# Patient Record
Sex: Female | Born: 1950 | Race: White | Hispanic: No | Smoking: Never smoker
Health system: Southern US, Community
[De-identification: ages and names within clinical notes are randomized; demographics above are authoritative.]

## PROBLEM LIST (undated history)

## (undated) DIAGNOSIS — F32A Depression, unspecified: Secondary | ICD-10-CM

## (undated) DIAGNOSIS — I10 Essential (primary) hypertension: Secondary | ICD-10-CM

## (undated) DIAGNOSIS — E119 Type 2 diabetes mellitus without complications: Secondary | ICD-10-CM

## (undated) DIAGNOSIS — M1990 Unspecified osteoarthritis, unspecified site: Secondary | ICD-10-CM

## (undated) DIAGNOSIS — R51 Headache: Secondary | ICD-10-CM

## (undated) DIAGNOSIS — F329 Major depressive disorder, single episode, unspecified: Secondary | ICD-10-CM

## (undated) DIAGNOSIS — R519 Headache, unspecified: Secondary | ICD-10-CM

## (undated) DIAGNOSIS — E039 Hypothyroidism, unspecified: Secondary | ICD-10-CM

## (undated) HISTORY — PX: LAPAROSCOPY: SHX197

## (undated) HISTORY — PX: CHOLECYSTECTOMY: SHX55

## (undated) HISTORY — PX: TRIGGER FINGER RELEASE: SHX641

## (undated) HISTORY — PX: TUBAL LIGATION: SHX77

## (undated) HISTORY — PX: BREAST BIOPSY: SHX20

## (undated) HISTORY — PX: TONSILLECTOMY: SUR1361

## (undated) HISTORY — PX: APPENDECTOMY: SHX54

## (undated) HISTORY — PX: ABDOMINAL HYSTERECTOMY: SHX81

## (undated) HISTORY — PX: BACK SURGERY: SHX140

## (undated) HISTORY — PX: RECTAL PROLAPSE REPAIR: SHX759

## (undated) HISTORY — PX: CARPAL TUNNEL RELEASE: SHX101

---

## 1998-01-29 ENCOUNTER — Ambulatory Visit (HOSPITAL_COMMUNITY): Admission: RE | Admit: 1998-01-29 | Discharge: 1998-01-29 | Payer: Self-pay

## 2000-12-14 ENCOUNTER — Ambulatory Visit (HOSPITAL_BASED_OUTPATIENT_CLINIC_OR_DEPARTMENT_OTHER): Admission: RE | Admit: 2000-12-14 | Discharge: 2000-12-14 | Payer: Self-pay | Admitting: Orthopaedic Surgery

## 2001-02-11 ENCOUNTER — Other Ambulatory Visit: Admission: RE | Admit: 2001-02-11 | Discharge: 2001-02-11 | Payer: Self-pay | Admitting: Obstetrics & Gynecology

## 2003-11-24 DIAGNOSIS — K5730 Diverticulosis of large intestine without perforation or abscess without bleeding: Secondary | ICD-10-CM | POA: Insufficient documentation

## 2004-05-23 ENCOUNTER — Ambulatory Visit: Payer: Self-pay | Admitting: Family Medicine

## 2004-07-21 ENCOUNTER — Ambulatory Visit: Payer: Self-pay | Admitting: Family Medicine

## 2004-08-11 ENCOUNTER — Ambulatory Visit: Payer: Self-pay | Admitting: Family Medicine

## 2004-09-24 ENCOUNTER — Ambulatory Visit: Payer: Self-pay | Admitting: Family Medicine

## 2004-10-30 ENCOUNTER — Ambulatory Visit: Payer: Self-pay | Admitting: Family Medicine

## 2004-12-09 ENCOUNTER — Ambulatory Visit: Payer: Self-pay | Admitting: Family Medicine

## 2004-12-18 ENCOUNTER — Ambulatory Visit (HOSPITAL_BASED_OUTPATIENT_CLINIC_OR_DEPARTMENT_OTHER): Admission: RE | Admit: 2004-12-18 | Discharge: 2004-12-18 | Payer: Self-pay | Admitting: Orthopedic Surgery

## 2004-12-18 ENCOUNTER — Ambulatory Visit (HOSPITAL_COMMUNITY): Admission: RE | Admit: 2004-12-18 | Discharge: 2004-12-18 | Payer: Self-pay | Admitting: Orthopedic Surgery

## 2005-05-13 ENCOUNTER — Ambulatory Visit: Payer: Self-pay | Admitting: Family Medicine

## 2005-05-14 ENCOUNTER — Encounter: Admission: RE | Admit: 2005-05-14 | Discharge: 2005-05-14 | Payer: Self-pay | Admitting: Family Medicine

## 2005-07-03 ENCOUNTER — Ambulatory Visit: Payer: Self-pay | Admitting: Family Medicine

## 2005-07-15 ENCOUNTER — Ambulatory Visit: Payer: Self-pay | Admitting: Family Medicine

## 2005-07-30 ENCOUNTER — Encounter: Admission: RE | Admit: 2005-07-30 | Discharge: 2005-10-28 | Payer: Self-pay | Admitting: Family Medicine

## 2005-08-07 ENCOUNTER — Ambulatory Visit: Payer: Self-pay | Admitting: Family Medicine

## 2005-08-27 ENCOUNTER — Ambulatory Visit: Payer: Self-pay | Admitting: Family Medicine

## 2005-12-08 ENCOUNTER — Ambulatory Visit: Payer: Self-pay | Admitting: Family Medicine

## 2006-03-09 ENCOUNTER — Ambulatory Visit: Payer: Self-pay | Admitting: Family Medicine

## 2006-03-18 ENCOUNTER — Emergency Department (HOSPITAL_COMMUNITY): Admission: EM | Admit: 2006-03-18 | Discharge: 2006-03-18 | Payer: Self-pay | Admitting: Family Medicine

## 2006-05-07 ENCOUNTER — Ambulatory Visit (HOSPITAL_COMMUNITY): Admission: RE | Admit: 2006-05-07 | Discharge: 2006-05-07 | Payer: Self-pay | Admitting: Sports Medicine

## 2006-05-19 ENCOUNTER — Ambulatory Visit (HOSPITAL_BASED_OUTPATIENT_CLINIC_OR_DEPARTMENT_OTHER): Admission: RE | Admit: 2006-05-19 | Discharge: 2006-05-19 | Payer: Self-pay | Admitting: Orthopedic Surgery

## 2006-06-10 ENCOUNTER — Ambulatory Visit: Payer: Self-pay | Admitting: Family Medicine

## 2006-06-21 ENCOUNTER — Encounter: Admission: RE | Admit: 2006-06-21 | Discharge: 2006-06-21 | Payer: Self-pay | Admitting: Family Medicine

## 2007-04-28 ENCOUNTER — Encounter (INDEPENDENT_AMBULATORY_CARE_PROVIDER_SITE_OTHER): Payer: Self-pay | Admitting: Internal Medicine

## 2007-04-28 ENCOUNTER — Telehealth (INDEPENDENT_AMBULATORY_CARE_PROVIDER_SITE_OTHER): Payer: Self-pay | Admitting: *Deleted

## 2007-05-11 ENCOUNTER — Encounter (INDEPENDENT_AMBULATORY_CARE_PROVIDER_SITE_OTHER): Payer: Self-pay | Admitting: Internal Medicine

## 2007-05-11 ENCOUNTER — Ambulatory Visit: Payer: Self-pay | Admitting: Nurse Practitioner

## 2007-05-16 ENCOUNTER — Telehealth (INDEPENDENT_AMBULATORY_CARE_PROVIDER_SITE_OTHER): Payer: Self-pay | Admitting: Nurse Practitioner

## 2007-05-17 ENCOUNTER — Encounter (INDEPENDENT_AMBULATORY_CARE_PROVIDER_SITE_OTHER): Payer: Self-pay | Admitting: Nurse Practitioner

## 2007-05-17 DIAGNOSIS — R945 Abnormal results of liver function studies: Secondary | ICD-10-CM | POA: Insufficient documentation

## 2007-05-17 DIAGNOSIS — E039 Hypothyroidism, unspecified: Secondary | ICD-10-CM | POA: Insufficient documentation

## 2007-05-17 LAB — CONVERTED CEMR LAB
ALT: 50 units/L — ABNORMAL HIGH (ref 0–35)
AST: 41 units/L — ABNORMAL HIGH (ref 0–37)
Albumin: 4.7 g/dL (ref 3.5–5.2)
Alkaline Phosphatase: 53 units/L (ref 39–117)
BUN: 9 mg/dL (ref 6–23)
Basophils Absolute: 0 10*3/uL (ref 0.0–0.1)
Basophils Relative: 1 % (ref 0–1)
CO2: 26 meq/L (ref 19–32)
Calcium: 9.9 mg/dL (ref 8.4–10.5)
Chloride: 106 meq/L (ref 96–112)
Creatinine, Ser: 0.87 mg/dL (ref 0.40–1.20)
Eosinophils Absolute: 0.2 10*3/uL (ref 0.2–0.7)
Eosinophils Relative: 3 % (ref 0–5)
Glucose, Bld: 97 mg/dL (ref 70–99)
HCT: 43.3 % (ref 36.0–46.0)
HCV Ab: NEGATIVE
Hemoglobin: 14.8 g/dL (ref 12.0–15.0)
Hep A IgM: NEGATIVE
Hep B C IgM: NEGATIVE
Hepatitis B Surface Ag: NEGATIVE
Lymphocytes Relative: 38 % (ref 12–46)
Lymphs Abs: 2.3 10*3/uL (ref 0.7–4.0)
MCHC: 34.2 g/dL (ref 30.0–36.0)
MCV: 92.9 fL (ref 78.0–100.0)
Monocytes Absolute: 0.5 10*3/uL (ref 0.1–1.0)
Monocytes Relative: 8 % (ref 3–12)
Neutro Abs: 3 10*3/uL (ref 1.7–7.7)
Neutrophils Relative %: 51 % (ref 43–77)
Platelets: 204 10*3/uL (ref 150–400)
Potassium: 4.5 meq/L (ref 3.5–5.3)
RBC: 4.66 M/uL (ref 3.87–5.11)
RDW: 13.5 % (ref 11.5–15.5)
Sodium: 145 meq/L (ref 135–145)
TSH: 5.991 microintl units/mL — ABNORMAL HIGH (ref 0.350–5.50)
Total Bilirubin: 1.3 mg/dL — ABNORMAL HIGH (ref 0.3–1.2)
Total Protein: 7.1 g/dL (ref 6.0–8.3)
WBC: 6 10*3/uL (ref 4.0–10.5)

## 2007-05-26 ENCOUNTER — Ambulatory Visit: Payer: Self-pay | Admitting: Nurse Practitioner

## 2007-05-26 DIAGNOSIS — I10 Essential (primary) hypertension: Secondary | ICD-10-CM | POA: Insufficient documentation

## 2007-05-26 DIAGNOSIS — E119 Type 2 diabetes mellitus without complications: Secondary | ICD-10-CM | POA: Insufficient documentation

## 2007-05-26 DIAGNOSIS — K219 Gastro-esophageal reflux disease without esophagitis: Secondary | ICD-10-CM | POA: Insufficient documentation

## 2007-05-26 LAB — CONVERTED CEMR LAB
Blood Glucose, Fingerstick: 181
Hgb A1c MFr Bld: 6.7 %

## 2007-06-01 ENCOUNTER — Ambulatory Visit (HOSPITAL_COMMUNITY): Admission: RE | Admit: 2007-06-01 | Discharge: 2007-06-01 | Payer: Self-pay | Admitting: Nurse Practitioner

## 2007-06-23 ENCOUNTER — Ambulatory Visit: Payer: Self-pay | Admitting: *Deleted

## 2007-07-13 ENCOUNTER — Ambulatory Visit: Payer: Self-pay | Admitting: Nurse Practitioner

## 2007-07-13 DIAGNOSIS — F341 Dysthymic disorder: Secondary | ICD-10-CM | POA: Insufficient documentation

## 2007-07-13 LAB — CONVERTED CEMR LAB
Bilirubin Urine: NEGATIVE
Blood Glucose, Fingerstick: 123
Blood in Urine, dipstick: NEGATIVE
Glucose, Urine, Semiquant: NEGATIVE
Ketones, urine, test strip: NEGATIVE
Nitrite: NEGATIVE
Protein, U semiquant: NEGATIVE
Specific Gravity, Urine: <1.005
TSH: 0.528 microintl units/mL (ref 0.350–5.50)
Urobilinogen, UA: 0.2
pH: 5

## 2007-07-14 ENCOUNTER — Encounter (INDEPENDENT_AMBULATORY_CARE_PROVIDER_SITE_OTHER): Payer: Self-pay | Admitting: Nurse Practitioner

## 2007-07-14 DIAGNOSIS — R32 Unspecified urinary incontinence: Secondary | ICD-10-CM | POA: Insufficient documentation

## 2007-07-14 DIAGNOSIS — S4980XA Other specified injuries of shoulder and upper arm, unspecified arm, initial encounter: Secondary | ICD-10-CM | POA: Insufficient documentation

## 2007-07-18 ENCOUNTER — Encounter (INDEPENDENT_AMBULATORY_CARE_PROVIDER_SITE_OTHER): Payer: Self-pay | Admitting: Nurse Practitioner

## 2007-07-19 ENCOUNTER — Encounter: Admission: RE | Admit: 2007-07-19 | Discharge: 2007-07-19 | Payer: Self-pay | Admitting: Family Medicine

## 2007-09-13 ENCOUNTER — Ambulatory Visit: Payer: Self-pay | Admitting: Nurse Practitioner

## 2007-09-13 DIAGNOSIS — M545 Low back pain, unspecified: Secondary | ICD-10-CM | POA: Insufficient documentation

## 2007-09-13 DIAGNOSIS — Z8669 Personal history of other diseases of the nervous system and sense organs: Secondary | ICD-10-CM | POA: Insufficient documentation

## 2007-09-13 LAB — CONVERTED CEMR LAB
Blood Glucose, Fingerstick: 136
Hgb A1c MFr Bld: 6 %

## 2007-09-15 ENCOUNTER — Ambulatory Visit (HOSPITAL_COMMUNITY): Admission: RE | Admit: 2007-09-15 | Discharge: 2007-09-15 | Payer: Self-pay | Admitting: Internal Medicine

## 2007-09-15 DIAGNOSIS — IMO0002 Reserved for concepts with insufficient information to code with codable children: Secondary | ICD-10-CM | POA: Insufficient documentation

## 2007-09-21 ENCOUNTER — Telehealth (INDEPENDENT_AMBULATORY_CARE_PROVIDER_SITE_OTHER): Payer: Self-pay | Admitting: Nurse Practitioner

## 2007-09-28 ENCOUNTER — Ambulatory Visit: Payer: Self-pay | Admitting: Nurse Practitioner

## 2007-11-24 ENCOUNTER — Ambulatory Visit: Payer: Self-pay | Admitting: Nurse Practitioner

## 2007-11-24 LAB — CONVERTED CEMR LAB
Blood Glucose, Fingerstick: 130
Blood in Urine, dipstick: NEGATIVE
Glucose, Urine, Semiquant: NEGATIVE
Hgb A1c MFr Bld: 5.9 %
Nitrite: NEGATIVE
Specific Gravity, Urine: >=1.03
Urobilinogen, UA: 0.2
pH: 5

## 2007-12-26 ENCOUNTER — Ambulatory Visit: Payer: Self-pay | Admitting: Nurse Practitioner

## 2007-12-27 ENCOUNTER — Encounter (INDEPENDENT_AMBULATORY_CARE_PROVIDER_SITE_OTHER): Payer: Self-pay | Admitting: Nurse Practitioner

## 2007-12-27 LAB — CONVERTED CEMR LAB
ALT: 25 units/L (ref 0–35)
AST: 21 units/L (ref 0–37)
Albumin: 4.6 g/dL (ref 3.5–5.2)
Alkaline Phosphatase: 43 units/L (ref 39–117)
BUN: 15 mg/dL (ref 6–23)
Basophils Absolute: 0 10*3/uL (ref 0.0–0.1)
Basophils Relative: 1 % (ref 0–1)
CO2: 22 meq/L (ref 19–32)
Calcium: 9.5 mg/dL (ref 8.4–10.5)
Chloride: 101 meq/L (ref 96–112)
Cholesterol: 210 mg/dL — ABNORMAL HIGH (ref 0–200)
Creatinine, Ser: 0.87 mg/dL (ref 0.40–1.20)
Eosinophils Absolute: 0.2 10*3/uL (ref 0.0–0.7)
Eosinophils Relative: 4 % (ref 0–5)
Glucose, Bld: 124 mg/dL — ABNORMAL HIGH (ref 70–99)
HCT: 44.2 % (ref 36.0–46.0)
HDL: 42 mg/dL (ref >39)
Hemoglobin: 14.3 g/dL (ref 12.0–15.0)
Lymphocytes Relative: 36 % (ref 12–46)
Lymphs Abs: 1.7 10*3/uL (ref 0.7–4.0)
MCHC: 32.4 g/dL (ref 30.0–36.0)
MCV: 100 fL (ref 78.0–100.0)
Monocytes Absolute: 0.3 10*3/uL (ref 0.1–1.0)
Monocytes Relative: 7 % (ref 3–12)
Neutro Abs: 2.4 10*3/uL (ref 1.7–7.7)
Neutrophils Relative %: 51 % (ref 43–77)
Platelets: 214 10*3/uL (ref 150–400)
Potassium: 5 meq/L (ref 3.5–5.3)
RBC: 4.42 M/uL (ref 3.87–5.11)
RDW: 14 % (ref 11.5–15.5)
Sodium: 140 meq/L (ref 135–145)
TSH: 9.002 microintl units/mL — ABNORMAL HIGH (ref 0.350–4.50)
Total Bilirubin: 0.8 mg/dL (ref 0.3–1.2)
Total CHOL/HDL Ratio: 5
Total Protein: 7.3 g/dL (ref 6.0–8.3)
Triglycerides: 441 mg/dL — ABNORMAL HIGH (ref <150)
WBC: 4.7 10*3/uL (ref 4.0–10.5)

## 2008-01-05 ENCOUNTER — Ambulatory Visit: Payer: Self-pay | Admitting: Nurse Practitioner

## 2008-01-05 DIAGNOSIS — E781 Pure hyperglyceridemia: Secondary | ICD-10-CM | POA: Insufficient documentation

## 2008-01-05 DIAGNOSIS — S838X9A Sprain of other specified parts of unspecified knee, initial encounter: Secondary | ICD-10-CM | POA: Insufficient documentation

## 2008-01-05 DIAGNOSIS — S86819A Strain of other muscle(s) and tendon(s) at lower leg level, unspecified leg, initial encounter: Secondary | ICD-10-CM

## 2008-02-23 ENCOUNTER — Ambulatory Visit: Payer: Self-pay | Admitting: Nurse Practitioner

## 2008-02-23 DIAGNOSIS — G2581 Restless legs syndrome: Secondary | ICD-10-CM | POA: Insufficient documentation

## 2008-02-23 LAB — CONVERTED CEMR LAB
Blood Glucose, Fingerstick: 142
Hgb A1c MFr Bld: 6.7 %

## 2008-03-01 ENCOUNTER — Encounter (INDEPENDENT_AMBULATORY_CARE_PROVIDER_SITE_OTHER): Payer: Self-pay | Admitting: Nurse Practitioner

## 2008-03-01 LAB — CONVERTED CEMR LAB
ALT: 36 units/L — ABNORMAL HIGH (ref 0–35)
AST: 29 units/L (ref 0–37)
Albumin: 4.7 g/dL (ref 3.5–5.2)
Alkaline Phosphatase: 38 units/L — ABNORMAL LOW (ref 39–117)
BUN: 19 mg/dL (ref 6–23)
Basophils Absolute: 0.1 10*3/uL (ref 0.0–0.1)
Basophils Relative: 1 % (ref 0–1)
CO2: 22 meq/L (ref 19–32)
Calcium: 9.5 mg/dL (ref 8.4–10.5)
Chloride: 102 meq/L (ref 96–112)
Cholesterol: 206 mg/dL — ABNORMAL HIGH (ref 0–200)
Creatinine, Ser: 1.03 mg/dL (ref 0.40–1.20)
Eosinophils Absolute: 0.2 10*3/uL (ref 0.0–0.7)
Eosinophils Relative: 3 % (ref 0–5)
Glucose, Bld: 129 mg/dL — ABNORMAL HIGH (ref 70–99)
HCT: 41.4 % (ref 36.0–46.0)
HDL: 46 mg/dL (ref >39)
Hemoglobin: 13.7 g/dL (ref 12.0–15.0)
LDL Cholesterol: 93 mg/dL (ref 0–99)
Lymphocytes Relative: 28 % (ref 12–46)
Lymphs Abs: 1.6 10*3/uL (ref 0.7–4.0)
MCHC: 33.1 g/dL (ref 30.0–36.0)
MCV: 99.8 fL (ref 78.0–100.0)
Monocytes Absolute: 0.3 10*3/uL (ref 0.1–1.0)
Monocytes Relative: 6 % (ref 3–12)
Neutro Abs: 3.7 10*3/uL (ref 1.7–7.7)
Neutrophils Relative %: 63 % (ref 43–77)
Platelets: 220 10*3/uL (ref 150–400)
Potassium: 4.6 meq/L (ref 3.5–5.3)
RBC: 4.15 M/uL (ref 3.87–5.11)
RDW: 14.5 % (ref 11.5–15.5)
Sodium: 140 meq/L (ref 135–145)
TSH: 2.88 microintl units/mL (ref 0.350–4.50)
Total Bilirubin: 1 mg/dL (ref 0.3–1.2)
Total CHOL/HDL Ratio: 4.5
Total Protein: 7.1 g/dL (ref 6.0–8.3)
Triglycerides: 336 mg/dL — ABNORMAL HIGH (ref <150)
VLDL: 67 mg/dL — ABNORMAL HIGH (ref 0–40)
WBC: 5.9 10*3/uL (ref 4.0–10.5)

## 2008-03-02 ENCOUNTER — Emergency Department (HOSPITAL_COMMUNITY): Admission: EM | Admit: 2008-03-02 | Discharge: 2008-03-02 | Payer: Self-pay | Admitting: Emergency Medicine

## 2008-03-21 ENCOUNTER — Telehealth (INDEPENDENT_AMBULATORY_CARE_PROVIDER_SITE_OTHER): Payer: Self-pay | Admitting: Nurse Practitioner

## 2008-03-23 ENCOUNTER — Telehealth (INDEPENDENT_AMBULATORY_CARE_PROVIDER_SITE_OTHER): Payer: Self-pay | Admitting: Nurse Practitioner

## 2008-03-23 ENCOUNTER — Emergency Department (HOSPITAL_COMMUNITY): Admission: EM | Admit: 2008-03-23 | Discharge: 2008-03-23 | Payer: Self-pay | Admitting: Family Medicine

## 2008-04-10 ENCOUNTER — Ambulatory Visit: Payer: Self-pay | Admitting: Family Medicine

## 2008-04-10 LAB — CONVERTED CEMR LAB: Blood Glucose, Fingerstick: 125

## 2008-05-18 ENCOUNTER — Ambulatory Visit: Payer: Self-pay | Admitting: Internal Medicine

## 2008-05-22 ENCOUNTER — Telehealth (INDEPENDENT_AMBULATORY_CARE_PROVIDER_SITE_OTHER): Payer: Self-pay | Admitting: Nurse Practitioner

## 2008-05-24 ENCOUNTER — Encounter (INDEPENDENT_AMBULATORY_CARE_PROVIDER_SITE_OTHER): Payer: Self-pay | Admitting: Nurse Practitioner

## 2008-05-28 ENCOUNTER — Encounter (INDEPENDENT_AMBULATORY_CARE_PROVIDER_SITE_OTHER): Payer: Self-pay | Admitting: Nurse Practitioner

## 2008-05-31 ENCOUNTER — Telehealth (INDEPENDENT_AMBULATORY_CARE_PROVIDER_SITE_OTHER): Payer: Self-pay | Admitting: Nurse Practitioner

## 2008-06-05 ENCOUNTER — Ambulatory Visit: Payer: Self-pay | Admitting: Nurse Practitioner

## 2008-06-22 ENCOUNTER — Telehealth (INDEPENDENT_AMBULATORY_CARE_PROVIDER_SITE_OTHER): Payer: Self-pay | Admitting: Nurse Practitioner

## 2008-08-31 ENCOUNTER — Telehealth (INDEPENDENT_AMBULATORY_CARE_PROVIDER_SITE_OTHER): Payer: Self-pay | Admitting: Nurse Practitioner

## 2008-10-19 ENCOUNTER — Ambulatory Visit: Payer: Self-pay | Admitting: Nurse Practitioner

## 2008-10-19 DIAGNOSIS — R5383 Other fatigue: Secondary | ICD-10-CM

## 2008-10-19 DIAGNOSIS — E1149 Type 2 diabetes mellitus with other diabetic neurological complication: Secondary | ICD-10-CM | POA: Insufficient documentation

## 2008-10-19 DIAGNOSIS — R5381 Other malaise: Secondary | ICD-10-CM | POA: Insufficient documentation

## 2008-10-19 LAB — CONVERTED CEMR LAB
ALT: 33 units/L (ref 0–35)
AST: 40 units/L — ABNORMAL HIGH (ref 0–37)
Albumin: 4.7 g/dL (ref 3.5–5.2)
Alkaline Phosphatase: 37 units/L — ABNORMAL LOW (ref 39–117)
BUN: 20 mg/dL (ref 6–23)
Bilirubin Urine: NEGATIVE
Blood Glucose, AC Bkfst: 211 mg/dL
Blood in Urine, dipstick: NEGATIVE
CO2: 23 meq/L (ref 19–32)
Calcium: 10.1 mg/dL (ref 8.4–10.5)
Chloride: 101 meq/L (ref 96–112)
Cholesterol, target level: 200 mg/dL
Cholesterol: 164 mg/dL (ref 0–200)
Creatinine, Ser: 0.94 mg/dL (ref 0.40–1.20)
Glucose, Bld: 209 mg/dL — ABNORMAL HIGH (ref 70–99)
Glucose, Urine, Semiquant: NEGATIVE
HCV Ab: NEGATIVE
HDL goal, serum: 40 mg/dL
HDL: 42 mg/dL (ref >39)
Hep A Total Ab: NEGATIVE
Hep B Core Total Ab: NEGATIVE
Hep B E Ab: NEGATIVE
Hep B S Ab: POSITIVE — AB
Hgb A1c MFr Bld: 8.2 %
Ketones, urine, test strip: NEGATIVE
LDL Cholesterol: 70 mg/dL (ref 0–99)
LDL Goal: 100 mg/dL
Microalb, Ur: 0.5 mg/dL (ref 0.00–1.89)
Nitrite: NEGATIVE
Potassium: 5.1 meq/L (ref 3.5–5.3)
Protein, U semiquant: NEGATIVE
Sodium: 140 meq/L (ref 135–145)
Specific Gravity, Urine: 1.025
TSH: 3.515 microintl units/mL (ref 0.350–4.500)
Total Bilirubin: 0.6 mg/dL (ref 0.3–1.2)
Total CHOL/HDL Ratio: 3.9
Total Protein: 7.1 g/dL (ref 6.0–8.3)
Triglycerides: 261 mg/dL — ABNORMAL HIGH (ref <150)
Urobilinogen, UA: 0.2
VLDL: 52 mg/dL — ABNORMAL HIGH (ref 0–40)
Vit D, 25-Hydroxy: 43 ng/mL (ref 30–89)
pH: 5.5

## 2008-10-22 ENCOUNTER — Encounter (INDEPENDENT_AMBULATORY_CARE_PROVIDER_SITE_OTHER): Payer: Self-pay | Admitting: Nurse Practitioner

## 2008-10-22 LAB — CONVERTED CEMR LAB

## 2008-10-26 ENCOUNTER — Telehealth (INDEPENDENT_AMBULATORY_CARE_PROVIDER_SITE_OTHER): Payer: Self-pay | Admitting: Nurse Practitioner

## 2008-10-26 ENCOUNTER — Encounter (INDEPENDENT_AMBULATORY_CARE_PROVIDER_SITE_OTHER): Payer: Self-pay | Admitting: Nurse Practitioner

## 2008-11-01 ENCOUNTER — Ambulatory Visit: Payer: Self-pay | Admitting: Internal Medicine

## 2008-11-01 DIAGNOSIS — J019 Acute sinusitis, unspecified: Secondary | ICD-10-CM | POA: Insufficient documentation

## 2008-11-01 LAB — CONVERTED CEMR LAB: Blood Glucose, Fingerstick: 141

## 2008-11-06 ENCOUNTER — Encounter (INDEPENDENT_AMBULATORY_CARE_PROVIDER_SITE_OTHER): Payer: Self-pay | Admitting: Nurse Practitioner

## 2008-11-14 ENCOUNTER — Telehealth (INDEPENDENT_AMBULATORY_CARE_PROVIDER_SITE_OTHER): Payer: Self-pay | Admitting: Nurse Practitioner

## 2008-11-29 ENCOUNTER — Ambulatory Visit: Payer: Self-pay | Admitting: Nurse Practitioner

## 2008-11-29 LAB — CONVERTED CEMR LAB: Blood Glucose, Fingerstick: 210

## 2009-01-10 ENCOUNTER — Ambulatory Visit: Payer: Self-pay | Admitting: Nurse Practitioner

## 2009-01-10 LAB — CONVERTED CEMR LAB: Blood Glucose, Fingerstick: 165

## 2009-01-15 ENCOUNTER — Ambulatory Visit: Payer: Self-pay | Admitting: Nurse Practitioner

## 2009-01-22 ENCOUNTER — Ambulatory Visit: Payer: Self-pay | Admitting: Nurse Practitioner

## 2009-01-24 ENCOUNTER — Encounter (INDEPENDENT_AMBULATORY_CARE_PROVIDER_SITE_OTHER): Payer: Self-pay | Admitting: Nurse Practitioner

## 2009-01-29 ENCOUNTER — Ambulatory Visit: Payer: Self-pay | Admitting: Nurse Practitioner

## 2009-01-29 DIAGNOSIS — M1710 Unilateral primary osteoarthritis, unspecified knee: Secondary | ICD-10-CM | POA: Insufficient documentation

## 2009-01-29 DIAGNOSIS — IMO0002 Reserved for concepts with insufficient information to code with codable children: Secondary | ICD-10-CM

## 2009-01-29 LAB — CONVERTED CEMR LAB: Blood Glucose, Fingerstick: 136

## 2009-01-30 ENCOUNTER — Encounter (INDEPENDENT_AMBULATORY_CARE_PROVIDER_SITE_OTHER): Payer: Self-pay | Admitting: Nurse Practitioner

## 2009-02-04 IMAGING — CR DG SHOULDER 2+V*L*
3 series · 3 of 3 positions shown · non-contrast
Comparison: none

CLINICAL DATA: Humerus pain.   History of prior fracture. 
 LEFT SHOULDER ? 3 VIEW:

[w shoulder ap internal left]
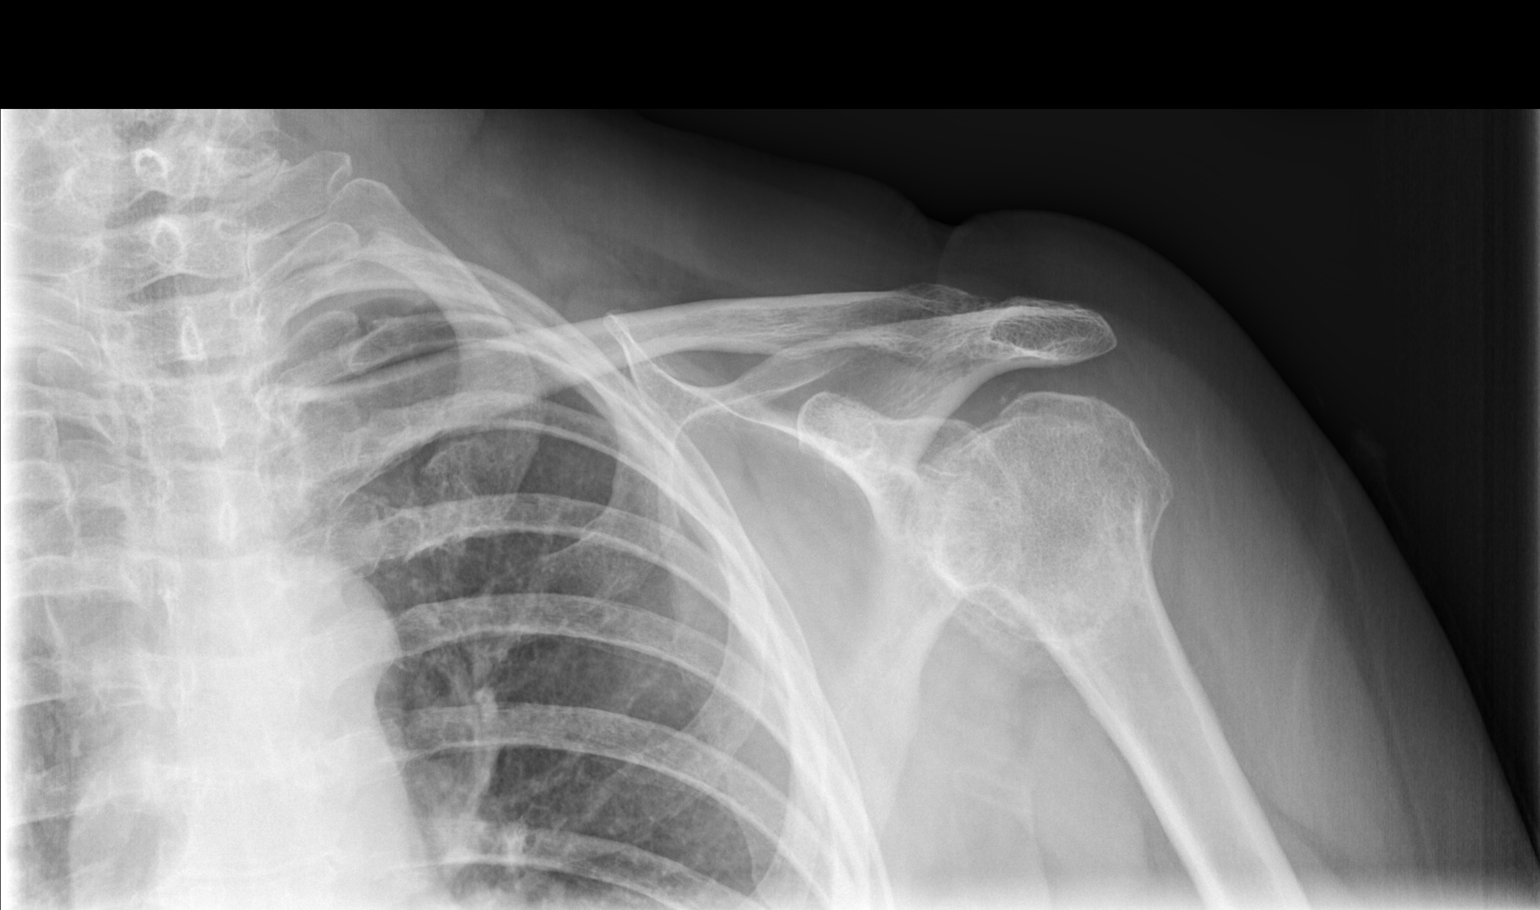

[w shoulder ap external left]
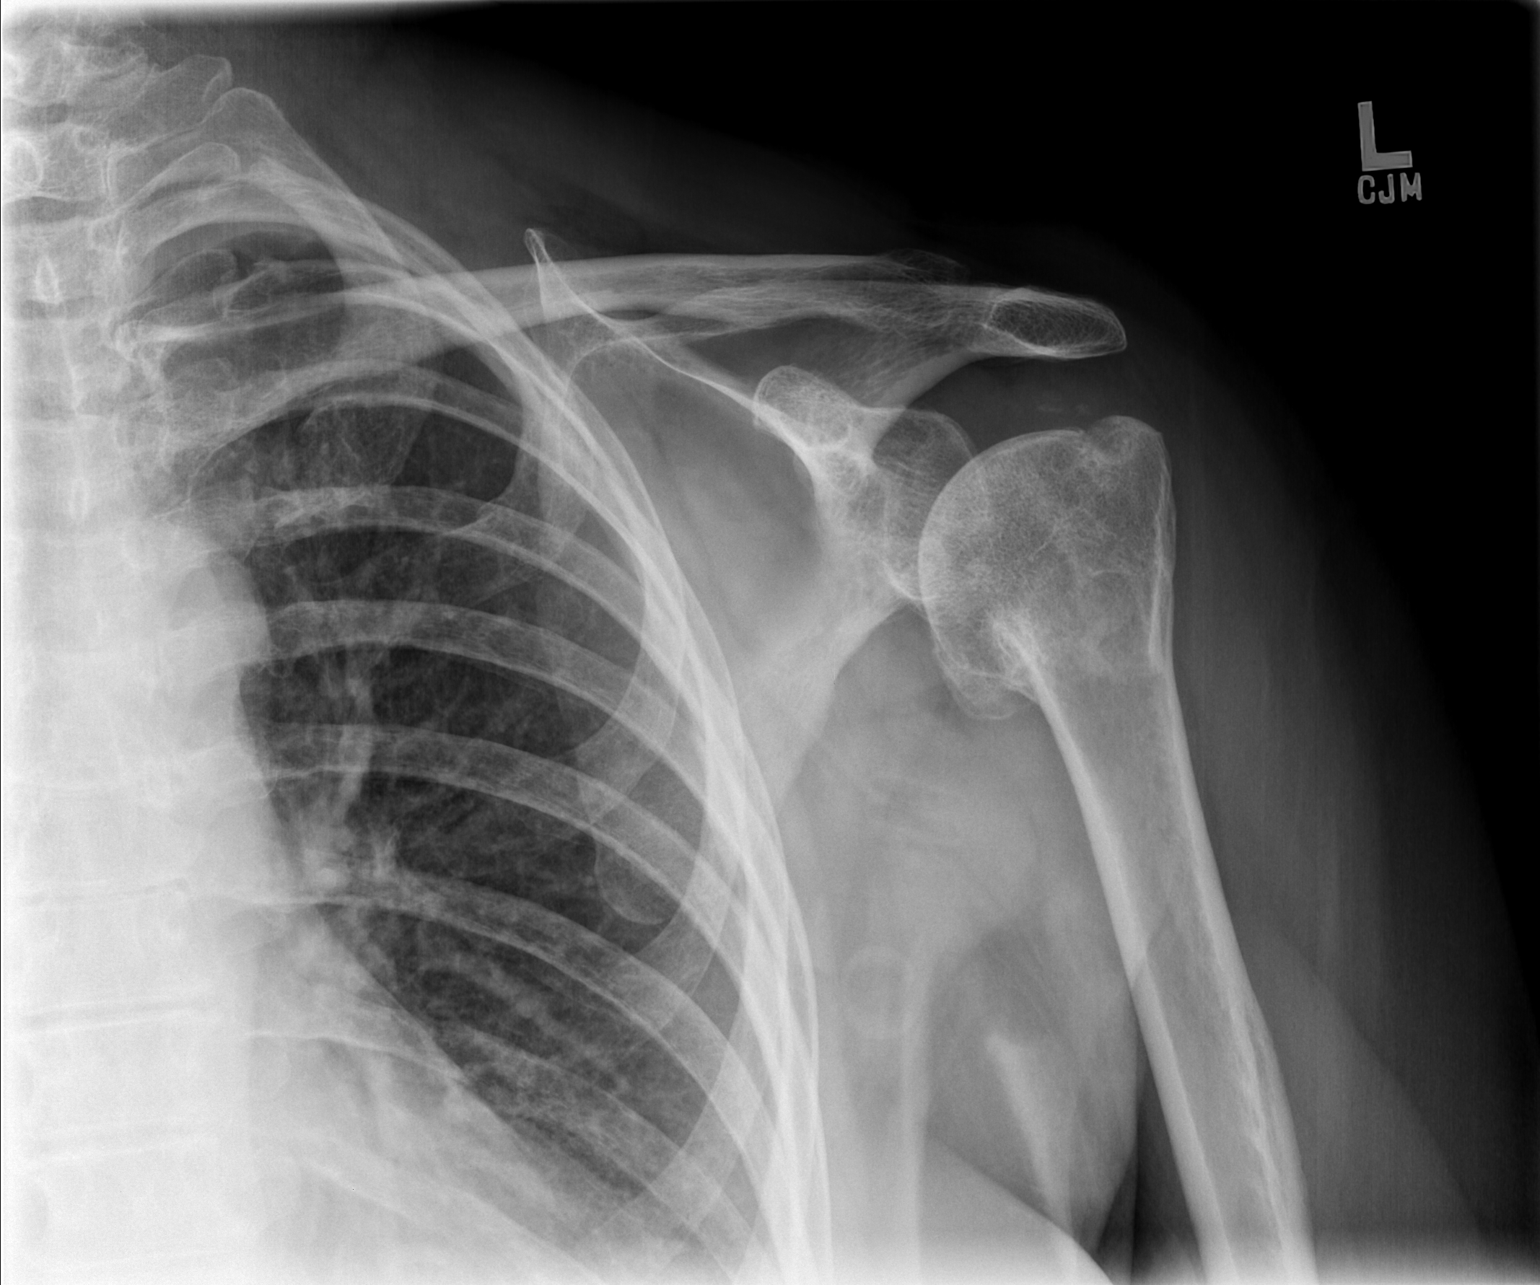

[w shoulder y view left]
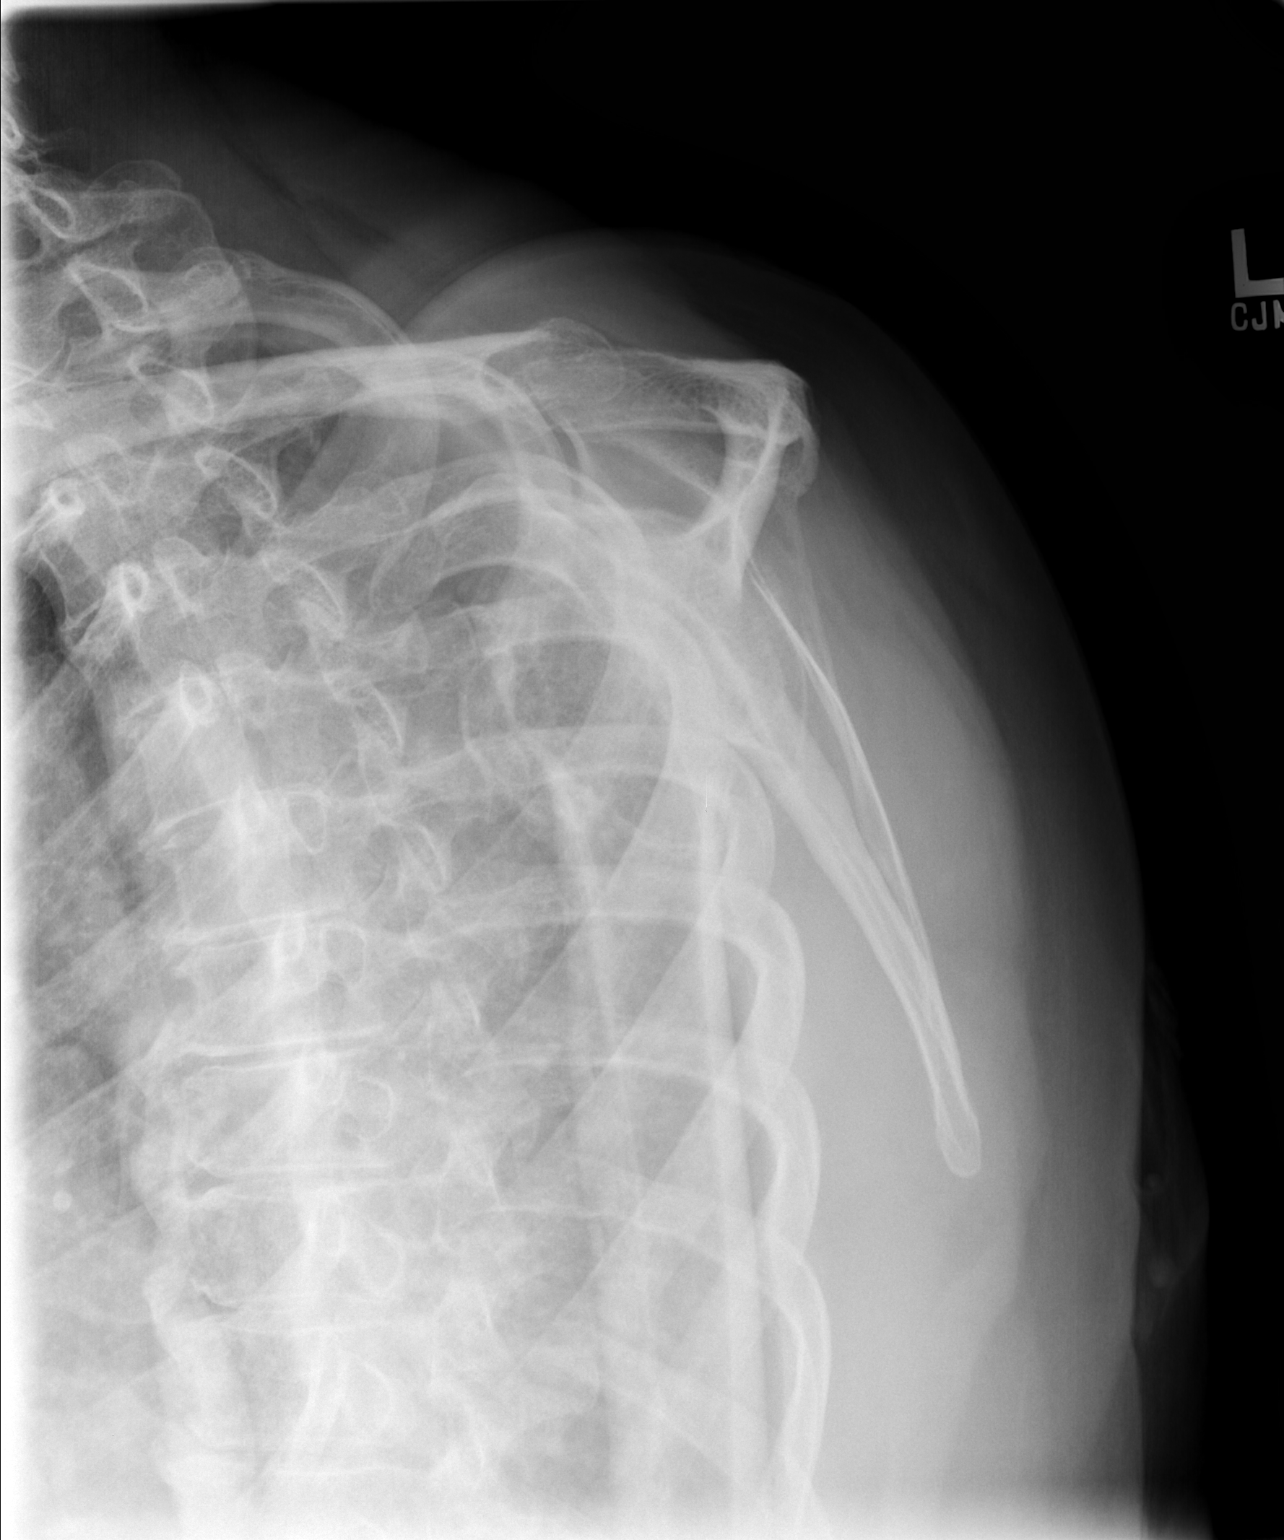

[3 of 3 positions shown; findings below may reference images not displayed]

FINDINGS: There is a remote healed surgical neck fracture of the humerus.  Patient has advanced appearing glenohumeral osteoarthritis with lesser degree of acromioclavicular degenerative change noted.  The humerus is located.  There is no fracture.  A few calcifications in the expected location of the rotator cuff are compatible with chronic calcific tendinopathy.  Imaged left lung and ribs appear normal.
IMPRESSION: 1.  No acute finding.
 2.  Remote surgical neck fracture of the humerus with glenohumeral osteoarthritis.
 3.  Chronic calcific rotator cuff tendinopathy noted.

## 2009-02-27 ENCOUNTER — Ambulatory Visit: Payer: Self-pay | Admitting: Nurse Practitioner

## 2009-02-27 LAB — CONVERTED CEMR LAB
Blood Glucose, Fingerstick: 222
Nitrite: POSITIVE
Urobilinogen, UA: 0.2

## 2009-03-01 ENCOUNTER — Ambulatory Visit: Payer: Self-pay | Admitting: Nurse Practitioner

## 2009-03-01 LAB — CONVERTED CEMR LAB
Blood in Urine, dipstick: NEGATIVE
Nitrite: NEGATIVE
Protein, U semiquant: 30
Urobilinogen, UA: 0.2

## 2009-04-22 ENCOUNTER — Telehealth (INDEPENDENT_AMBULATORY_CARE_PROVIDER_SITE_OTHER): Payer: Self-pay | Admitting: Nurse Practitioner

## 2009-04-24 ENCOUNTER — Ambulatory Visit: Payer: Self-pay | Admitting: Nurse Practitioner

## 2009-04-24 LAB — CONVERTED CEMR LAB
Bilirubin Urine: NEGATIVE
Ketones, urine, test strip: NEGATIVE
Protein, U semiquant: 30
Urobilinogen, UA: 0.2

## 2009-05-01 ENCOUNTER — Other Ambulatory Visit: Admission: RE | Admit: 2009-05-01 | Discharge: 2009-05-01 | Payer: Self-pay | Admitting: Family Medicine

## 2009-05-01 ENCOUNTER — Ambulatory Visit: Payer: Self-pay | Admitting: Nurse Practitioner

## 2009-05-01 DIAGNOSIS — Z780 Asymptomatic menopausal state: Secondary | ICD-10-CM | POA: Insufficient documentation

## 2009-05-01 LAB — CONVERTED CEMR LAB
ALT: 29 units/L (ref 0–35)
AST: 30 units/L (ref 0–37)
Alkaline Phosphatase: 28 units/L — ABNORMAL LOW (ref 39–117)
Basophils Relative: 0 % (ref 0–1)
Bilirubin Urine: NEGATIVE
Blood Glucose, Fingerstick: 138
Chloride: 107 meq/L (ref 96–112)
Creatinine, Ser: 1.06 mg/dL (ref 0.40–1.20)
Eosinophils Absolute: 0.1 10*3/uL (ref 0.0–0.7)
Hgb A1c MFr Bld: 8.1 %
Ketones, urine, test strip: NEGATIVE
Lymphs Abs: 1.8 10*3/uL (ref 0.7–4.0)
MCV: 96.3 fL (ref 78.0–100.0)
Neutrophils Relative %: 63 % (ref 43–77)
Nitrite: NEGATIVE
Platelets: 312 10*3/uL (ref 150–400)
Specific Gravity, Urine: 1.015
TSH: 3.311 microintl units/mL (ref 0.350–4.500)
Total Bilirubin: 0.5 mg/dL (ref 0.3–1.2)
Total CHOL/HDL Ratio: 4
VLDL: 42 mg/dL — ABNORMAL HIGH (ref 0–40)
WBC: 6.3 10*3/uL (ref 4.0–10.5)
pH: 5.5

## 2009-05-06 ENCOUNTER — Encounter (INDEPENDENT_AMBULATORY_CARE_PROVIDER_SITE_OTHER): Payer: Self-pay | Admitting: Nurse Practitioner

## 2009-05-07 ENCOUNTER — Encounter (INDEPENDENT_AMBULATORY_CARE_PROVIDER_SITE_OTHER): Payer: Self-pay | Admitting: Nurse Practitioner

## 2009-05-13 ENCOUNTER — Ambulatory Visit: Payer: Self-pay | Admitting: Nurse Practitioner

## 2009-05-14 ENCOUNTER — Encounter (INDEPENDENT_AMBULATORY_CARE_PROVIDER_SITE_OTHER): Payer: Self-pay | Admitting: Nurse Practitioner

## 2009-05-16 ENCOUNTER — Encounter (INDEPENDENT_AMBULATORY_CARE_PROVIDER_SITE_OTHER): Payer: Self-pay | Admitting: Nurse Practitioner

## 2009-05-16 ENCOUNTER — Ambulatory Visit (HOSPITAL_COMMUNITY): Admission: RE | Admit: 2009-05-16 | Discharge: 2009-05-16 | Payer: Self-pay | Admitting: Nurse Practitioner

## 2009-05-27 ENCOUNTER — Encounter (INDEPENDENT_AMBULATORY_CARE_PROVIDER_SITE_OTHER): Payer: Self-pay | Admitting: Nurse Practitioner

## 2009-05-27 ENCOUNTER — Encounter: Admission: RE | Admit: 2009-05-27 | Discharge: 2009-05-27 | Payer: Self-pay | Admitting: Internal Medicine

## 2009-05-29 ENCOUNTER — Ambulatory Visit: Payer: Self-pay | Admitting: Nurse Practitioner

## 2009-05-29 LAB — CONVERTED CEMR LAB: Blood Glucose, Fingerstick: 236

## 2009-05-31 ENCOUNTER — Encounter (INDEPENDENT_AMBULATORY_CARE_PROVIDER_SITE_OTHER): Payer: Self-pay | Admitting: Nurse Practitioner

## 2009-06-05 ENCOUNTER — Telehealth (INDEPENDENT_AMBULATORY_CARE_PROVIDER_SITE_OTHER): Payer: Self-pay | Admitting: Nurse Practitioner

## 2009-08-07 ENCOUNTER — Ambulatory Visit: Payer: Self-pay | Admitting: Nurse Practitioner

## 2009-08-07 DIAGNOSIS — G43909 Migraine, unspecified, not intractable, without status migrainosus: Secondary | ICD-10-CM | POA: Insufficient documentation

## 2009-08-07 DIAGNOSIS — G479 Sleep disorder, unspecified: Secondary | ICD-10-CM | POA: Insufficient documentation

## 2009-08-07 DIAGNOSIS — B370 Candidal stomatitis: Secondary | ICD-10-CM | POA: Insufficient documentation

## 2009-08-13 ENCOUNTER — Telehealth (INDEPENDENT_AMBULATORY_CARE_PROVIDER_SITE_OTHER): Payer: Self-pay | Admitting: Nurse Practitioner

## 2009-10-02 ENCOUNTER — Ambulatory Visit: Payer: Self-pay | Admitting: Nurse Practitioner

## 2009-10-14 ENCOUNTER — Encounter (INDEPENDENT_AMBULATORY_CARE_PROVIDER_SITE_OTHER): Payer: Self-pay | Admitting: Nurse Practitioner

## 2009-11-13 ENCOUNTER — Encounter: Admission: RE | Admit: 2009-11-13 | Discharge: 2009-11-13 | Payer: Self-pay | Admitting: Family Medicine

## 2009-12-19 ENCOUNTER — Encounter (INDEPENDENT_AMBULATORY_CARE_PROVIDER_SITE_OTHER): Payer: Self-pay | Admitting: Nurse Practitioner

## 2010-01-07 ENCOUNTER — Encounter: Admission: RE | Admit: 2010-01-07 | Discharge: 2010-01-07 | Payer: Self-pay | Admitting: Internal Medicine

## 2010-04-28 ENCOUNTER — Telehealth (INDEPENDENT_AMBULATORY_CARE_PROVIDER_SITE_OTHER): Payer: Self-pay | Admitting: Nurse Practitioner

## 2010-05-05 ENCOUNTER — Encounter (INDEPENDENT_AMBULATORY_CARE_PROVIDER_SITE_OTHER): Payer: Self-pay | Admitting: *Deleted

## 2010-05-23 ENCOUNTER — Encounter
Admission: RE | Admit: 2010-05-23 | Discharge: 2010-05-23 | Payer: Self-pay | Source: Home / Self Care | Attending: Internal Medicine | Admitting: Internal Medicine

## 2010-07-06 ENCOUNTER — Encounter: Payer: Self-pay | Admitting: Family Medicine

## 2010-07-15 NOTE — Progress Notes (Signed)
Summary: Lantus   Phone Note Call from Patient Call back at [facility] Phone [phone]   Summary of Call: the pt wants to make sure the medical assistant inform to the [facility] that she needs lantus solostar 55 unitis not 50 units.  [facility] fNP Initial call taken by: Manon Hilding,  August 13, 2009 9:47 AM  Follow-up for Phone Call        Christy Baldwin  August 13, 2009 2:37 PM Spoke with pt and she was calling to see if insulin Rx had been increased at the pharmacy so that when she goes to pick up Rx it will be at the correct dosage.  I spoke with [place] and she said that they have the dosage increase listed.  I told pt that if she has any other concerns with this to give [place] a call. Follow-up by: Christy Baldwin,  August 13, 2009 2:39 PM

## 2010-07-15 NOTE — Assessment & Plan Note (Signed)
Summary: Diabetes/HTN   Vital Signs:  Patient profile:   60 year old female Menstrual status:  postmenopausal Weight:      213.2 pounds BMI:     33.02 BSA:     2.09 Temp:     97.9 degrees F oral Pulse rate:   76 / minute Pulse rhythm:   regular BP sitting:   92 / 60  (left arm) Cuff size:   regular  Vitals Entered By: Levon Hedger (October 02, 2009 9:33 AM) CC: follow-up visit, Hypertension Management, Lipid Management Is Patient Diabetic? Yes Pain Assessment Patient in pain? no     Location: back CBG Result 163 CBG Device ID [id number]  Does patient need assistance? Functional Status Self care Ambulation Normal   CC:  follow-up visit, Hypertension Management, and Lipid Management.  History of Present Illness:  Pt into the office for 6 weeks f/u for diabetes. Insulin increased during the last visit. She presents today with blood sugar and blood pressure log  Obese - pt has continued to exercise.  She has lost 1 pounds since her last visit.  Overall feels good  Diabetes Management History:      The patient is a 60 years old female who comes in for evaluation of Type 2 Diabetes Mellitus.  She has not been enrolled in the "Diabetic Education Program".  She states understanding of dietary principles and is following her diet appropriately.  No sensory loss is reported.  She is checking home blood sugars.  She says that she is exercising.  Type of exercise includes: walking.  Duration of exercise is estimated to be <30  She is doing this 5 times per week.        Hypoglycemic symptoms are not occurring.  No hyperglycemic symptoms are reported.        The following changes have been made to her treatment plan since last visit: insulin dosing.  Treatment plan changes were initiated by MD.        Her home blood sugars include fasting blood sugars: highest: 187, lowest: 106.    Hypertension History:      She denies headache, chest pain, and palpitations.  She notes no problems with  any antihypertensive medication side effects.  Blood pressure log presents today in office.        Positive major cardiovascular risk factors include female age 68 years old or older, diabetes, hyperlipidemia, and hypertension.  Negative major cardiovascular risk factors include negative family history for ischemic heart disease and non-tobacco-user status.        Further assessment for target organ damage reveals no history of ASHD, cardiac end-organ damage (CHF/LVH), stroke/TIA, peripheral vascular disease, renal insufficiency, or hypertensive retinopathy.    Lipid Management History:      Positive NCEP/ATP III risk factors include female age 42 years old or older, diabetes, HDL cholesterol less than 40, and hypertension.  Negative NCEP/ATP III risk factors include no family history for ischemic heart disease, non-tobacco-user status, no ASHD (atherosclerotic heart disease), no prior stroke/TIA, no peripheral vascular disease, and no history of aortic aneurysm.        The patient states that she knows about the "Therapeutic Lifestyle Change" diet.  Her compliance with the TLC diet is fair.  The patient does not know about adjunctive measures for cholesterol lowering.  She expresses no side effects from her lipid-lowering medication.  The patient denies any symptoms to suggest myopathy or liver disease.     Habits & Providers  Alcohol-Tobacco-Diet  Alcohol drinks/day: 0     Tobacco Status: never     Passive Smoke Exposure: no  Exercise-Depression-Behavior     Does Patient Exercise: yes     Exercise Counseling: to improve exercise regimen     Type of exercise: walking     Exercise (avg: min/session): <30     Times/week: 5     Have you felt down or hopeless? no     Have you felt little pleasure in things? no     Depression Counseling: not indicated; screening negative for depression     STD Risk: never     Drug Use: never     Seat Belt Use: 100     Sun Exposure: occasionally  Diabetic  Foot Exam Last Podiatry Exam Date: 10/19/2008 Foot Inspection Is there a history of a foot ulcer?              No Is there a foot ulcer now?              No Can the patient see the bottom of their feet?          No Are the shoes appropriate in style and fit?          Yes Is there swelling or an abnormal foot shape?          No Are the toenails long?                No Are the toenails thick?                No Are the toenails ingrown?              No Is there heavy callous build-up?              No Is there pain in the calf muscle (Intermittent claudication) when walking?    NoIs there a claw toe deformity?              No Is there elevated skin temperature?            No Is there limited ankle dorsiflexion?            No Is there foot or ankle muscle weakness?            No  Diabetic Foot Care Education Pulse Check          Right Foot          Left Foot Dorsalis Pedis:        normal            normal  High Risk Feet? No  Medications Prior to Update: 1)  Levothroid 112 Mcg  Tabs (Levothyroxine Sodium) .Marland Kitchen.. 1 Tablet By Mouth Daily For Thyroid 2)  Lyrica 150 Mg  Caps (Pregabalin) .Marland Kitchen.. 1 By Mouth Two Times A Day 3)  Lisinopril 20 Mg  Tabs (Lisinopril) .Marland Kitchen.. 1 Tablet By Mouth Twice Daily For Blood Pressure 4)  Janumet 50-500 Mg Tabs (Sitagliptin-Metformin Hcl) .... One Tablet By Mouth Two Times A Day For Diabetes 5)  Nexium 40 Mg  Cpdr (Esomeprazole Magnesium) .Marland Kitchen.. 1 Tablet By Mouth Daily For Stomach 6)  Lexapro 10 Mg  Tabs (Escitalopram Oxalate) .Marland Kitchen.. 1 Tablet By Mouth Daily For Mood 7)  Oxybutynin Chloride 5 Mg  Tb24 (Oxybutynin Chloride) .Marland Kitchen.. 1 Tablet By Mouth Two Times A Day 8)  Norvasc 2.5 Mg  Tabs (Amlodipine Besylate) .Marland Kitchen.. 1 Tablet By Mouth Daily 9)  Aspirin Ec  Low Dose 81 Mg  Tbec (Aspirin) .Marland Kitchen.. 1 Tablet By Mouth Daily 10)  Tricor 145 Mg  Tabs (Fenofibrate) .Marland Kitchen.. 1 Tablet By Mouth Daily For Cholesterol 11)  Requip 1 Mg Tabs (Ropinirole Hcl) .... One Tablet By Mouth Nightly For  Cramps 12)  Lovaza 1 Gm Caps (Omega-3-Acid Ethyl Esters) .... 2 Capusules By Mouth Two Times A Day For Cholesterol 13)  Black Cohosh Root 540 Mg Caps (Black Cohosh) .... Take Two Tablets Daily 14)  Vitamin C Cr 500 Mg Cr-Caps (Ascorbic Acid) .... Take One Tablet By Mouth Daily 15)  Proventil Hfa 108 (90 Base) Mcg/act Aers (Albuterol Sulfate) .... Use 2 Puffs Every 4-6 Hours For Cough 16)  High Potency Calcium 333.4-133 Mg-Unit Tabs (Calcium Carbonate-Vit D-Min) .... One Tab Daily 17)  Lantus Solostar 100 Unit/ml Soln (Insulin Glargine) .... 55 Units Subcutaneously Nightly For Diabetes **note Change in Dose** 18)  Pen Needles 29g X 12mm Misc (Insulin Pen Needle) .... Use With Lantus Pen At Night 19)  Lortab 5 5-500 Mg Tabs (Hydrocodone-Acetaminophen) .... One Tablet By Mouth Daily As Needed For Pain 20)  Nystatin 100000 Unit/ml Susp (Nystatin) .... 5 Ml By Mouth Four Times Per Day Swish and Spit 21)  Imitrex 100 Mg Tabs (Sumatriptan Succinate) .... One Tablet By Mouth At Onset of Headache. May Repeat in 2 Hours If Headache Persists  Allergies (verified): No Known Drug Allergies  Review of Systems General:  Denies fever; "off balance on some days". CV:  Denies chest pain or discomfort. Resp:  Denies cough. GI:  Denies abdominal pain.  Physical Exam  General:  alert.   Head:  normocephalic.   Ears:  ear piercing(s) noted.   Lungs:  normal breath sounds.   Heart:  normal rate and regular rhythm.   Msk:  bunions  Neurologic:  alert & oriented X3.    Diabetes Management Exam:    Foot Exam (with socks and/or shoes not present):       Inspection:          Left foot: normal          Right foot: normal       Nails:          Left foot: normal          Right foot: normal   Impression & Recommendations:  Problem # 1:  DIABETES MELLITUS, TYPE II (ICD-250.00) ok with increase in insulin continue current dose Her updated medication list for this problem includes:    Lisinopril 20 Mg  Tabs (Lisinopril) .Marland Kitchen... 1 tablet by mouth twice daily for blood pressure    Janumet 50-500 Mg Tabs (Sitagliptin-metformin hcl) ..... One tablet by mouth two times a day for diabetes    Aspirin Ec Low Dose 81 Mg Tbec (Aspirin) .Marland Kitchen... 1 tablet by mouth daily    Lantus Solostar 100 Unit/ml Soln (Insulin glargine) .Marland KitchenMarland KitchenMarland KitchenMarland Kitchen 55 units subcutaneously nightly for diabetes **note change in dose**  Orders: Capillary Blood Glucose/CBG (62130)  Problem # 2:  HYPERTENSION, BENIGN ESSENTIAL (ICD-401.1) BP is stable actually may be too low at time will monitor if dizziness persists will need to decrease meds Her updated medication list for this problem includes:    Lisinopril 20 Mg Tabs (Lisinopril) .Marland Kitchen... 1 tablet by mouth twice daily for blood pressure    Norvasc 2.5 Mg Tabs (Amlodipine besylate) .Marland Kitchen... 1 tablet by mouth daily  Problem # 3:  HYPERTRIGLYCERIDEMIA (ICD-272.1) stable  continue current meds Her updated medication list for this problem includes:  Tricor 145 Mg Tabs (Fenofibrate) .Marland Kitchen... 1 tablet by mouth daily for cholesterol    Lovaza 1 Gm Caps (Omega-3-acid ethyl esters) .Marland Kitchen... 2 capusules by mouth two times a day for cholesterol  Problem # 4:  HYPOTHYROIDISM (ICD-244.9) continue current meds Her updated medication list for this problem includes:    Levothroid 112 Mcg Tabs (Levothyroxine sodium) .Marland Kitchen... 1 tablet by mouth daily for thyroid  Complete Medication List: 1)  Levothroid 112 Mcg Tabs (Levothyroxine sodium) .Marland Kitchen.. 1 tablet by mouth daily for thyroid 2)  Lisinopril 20 Mg Tabs (Lisinopril) .Marland Kitchen.. 1 tablet by mouth twice daily for blood pressure 3)  Janumet 50-500 Mg Tabs (Sitagliptin-metformin hcl) .... One tablet by mouth two times a day for diabetes 4)  Nexium 40 Mg Cpdr (Esomeprazole magnesium) .Marland Kitchen.. 1 tablet by mouth daily for stomach 5)  Lexapro 10 Mg Tabs (Escitalopram oxalate) .Marland Kitchen.. 1 tablet by mouth daily for mood 6)  Oxybutynin Chloride 5 Mg Tb24 (Oxybutynin chloride) .Marland Kitchen.. 1 tablet  by mouth two times a day 7)  Norvasc 2.5 Mg Tabs (Amlodipine besylate) .Marland Kitchen.. 1 tablet by mouth daily 8)  Aspirin Ec Low Dose 81 Mg Tbec (Aspirin) .Marland Kitchen.. 1 tablet by mouth daily 9)  Tricor 145 Mg Tabs (Fenofibrate) .Marland Kitchen.. 1 tablet by mouth daily for cholesterol 10)  Requip 1 Mg Tabs (Ropinirole hcl) .... One tablet by mouth nightly for cramps 11)  Lovaza 1 Gm Caps (Omega-3-acid ethyl esters) .... 2 capusules by mouth two times a day for cholesterol 12)  Black Cohosh Root 540 Mg Caps (Black cohosh) .... Take two tablets daily 13)  Vitamin C Cr 500 Mg Cr-caps (Ascorbic acid) .... Take one tablet by mouth daily 14)  Proventil Hfa 108 (90 Base) Mcg/act Aers (Albuterol sulfate) .... Use 2 puffs every 4-6 hours for cough 15)  High Potency Calcium 333.4-133 Mg-unit Tabs (Calcium carbonate-vit d-min) .... One tab daily 16)  Lantus Solostar 100 Unit/ml Soln (Insulin glargine) .... 55 units subcutaneously nightly for diabetes **note change in dose** 17)  Pen Needles 29g X 12mm Misc (Insulin pen needle) .... Use with lantus pen at night 18)  Lortab 5 5-500 Mg Tabs (Hydrocodone-acetaminophen) .... One tablet by mouth daily as needed for pain 19)  Nystatin 100000 Unit/ml Susp (Nystatin) .... 5 ml by mouth four times per day swish and spit 20)  Imitrex 100 Mg Tabs (Sumatriptan succinate) .... One tablet by mouth at onset of headache. may repeat in 2 hours if headache persists 21)  Loratadine 10 Mg Tabs (Loratadine) .... One tablet by mouth daily for allergies  Diabetes Management Assessment/Plan:      The following lipid goals have been established for the patient: Total cholesterol goal of 200; LDL cholesterol goal of 100; HDL cholesterol goal of 40; Triglyceride goal of 150.  Her blood pressure goal is < 130/80.    Hypertension Assessment/Plan:      The patient's hypertensive risk group is category C: Target organ damage and/or diabetes.  Her calculated 10 year risk of coronary heart disease is 9 %.  Today's  blood pressure is 92/60.  Her blood pressure goal is < 130/80.  Lipid Assessment/Plan:      Based on NCEP/ATP III, the patient's risk factor category is "history of diabetes".  The patient's lipid goals are as follows: Total cholesterol goal is 200; LDL cholesterol goal is 100; HDL cholesterol goal is 40; Triglyceride goal is 150.  Her LDL cholesterol goal has not been met.     Patient Instructions: 1)  Your blood pressure and blood sugar are doing well. Continue to monitor 2)  If you feel the dizziness monitor and write down when it happens and time of day.  Sometimes it means that we need to change the times of some of your medications 3)  Follow up in 3 months with n.martin, fnp - July 2011 4)  Will need cbg, hgba1c Prescriptions: LORATADINE 10 MG TABS (LORATADINE) One tablet by mouth daily for allergies  #30 x 5   Entered and Authorized by:   Lehman Prom FNP   Signed by:   Lehman Prom FNP on 10/02/2009   Method used:   Print then Give to Patient   RxID:   [id number]   Last LDL:                                                 64 (05/01/2009 9:47:00 PM)      Diabetic Foot Exam Last Podiatry Exam Date: 10/19/2008 Pulse Check          Right Foot          Left Foot Dorsalis Pedis:        normal            normal  High Risk Feet? No   Prevention & Chronic Care Immunizations   Influenza vaccine: Fluvax 3+  (05/02/2009)    Tetanus booster: 06/16/2003: historical per pt    Pneumococcal vaccine: Pneumovax  (11/25/2007)  Colorectal Screening   Hemoccult: Not documented   Hemoccult action/deferral: Ordered  (05/01/2009)    Colonoscopy: One 6mm polyp in the transverse colon diverticulosis  (11/24/2003)   Colonoscopy action/deferral: Deferred  (05/01/2009)  Other Screening   Pap smear:  Specimen Adequacy: Satisfactory for evaluation.   Interpretation/Result:Negative for intraepithelial Lesion or Malignancy.     (05/01/2009)   Pap smear action/deferral: Ordered   (05/01/2009)   Pap smear due: 05/2011    Mammogram: 76098.0^MM BREAST SURGICAL SPECIMEN  (05/27/2009)   Mammogram action/deferral: Ordered  (05/01/2009)   Smoking status: never  (10/02/2009)  Diabetes Mellitus   HgbA1C: 8.1  (08/07/2009)   HgbA1C action/deferral: Ordered  (05/01/2009)    Eye exam: normal  (05/24/2008)    Foot exam: yes  (10/02/2009)   Foot exam action/deferral: Do today   High risk foot: No  (10/02/2009)   Foot care education: Done  (05/01/2009)    Urine microalbumin/creatinine ratio: Not documented  Lipids   Total Cholesterol: 141  (05/01/2009)   Lipid panel action/deferral: Lipid Panel ordered   LDL: 64  (05/01/2009)   LDL Direct: Not documented   HDL: 35  (05/01/2009)   Triglycerides: 211  (05/01/2009)    SGOT (AST): 30  (05/01/2009)   SGPT (ALT): 29  (05/01/2009)   Alkaline phosphatase: 28  (05/01/2009)   Total bilirubin: 0.5  (05/01/2009)  Hypertension   Last Blood Pressure: 92 / 60  (10/02/2009)   Serum creatinine: 1.06  (05/01/2009)   Serum potassium 5.3  (05/01/2009)    Hypertension flowsheet reviewed?: Yes  Self-Management Support :    Diabetes self-management support: Not documented    Hypertension self-management support: Education handout  (10/02/2009)   Hypertension education handout printed    Lipid self-management support: Not documented

## 2010-07-15 NOTE — Letter (Signed)
Summary: requesting records for self  requesting records for self   Imported By: Arta Bruce 12/19/2009 11:26:35  _____________________________________________________________________  External Attachment:    Type:   Image     Comment:   External Document

## 2010-07-15 NOTE — Letter (Signed)
Summary: TEST ORDER FORM//DEXA//APPT DATE & TIME  TEST ORDER FORM//DEXA//APPT DATE & TIME   Imported By: Arta Bruce 06/17/2009 12:59:45  _____________________________________________________________________  External Attachment:    Type:   Image     Comment:   External Document

## 2010-07-15 NOTE — Assessment & Plan Note (Signed)
Summary: Diabetes   Vital Signs:  Patient profile:   60 year old female Menstrual status:  postmenopausal Weight:      214.2 pounds BMI:     33.17 BSA:     2.10 Temp:     98.0 degrees F oral Pulse rate:   73 / minute Pulse rhythm:   regular Resp:     16 per minute BP sitting:   120 / 73  (left arm) Cuff size:   regular  Vitals Entered By: Levon Hedger (August 07, 2009 10:40 AM) CC: follow-up visit 2 month DM...has been having drainage in back of throat x 1/12 week with some nasal congestion, Back Pain, Headache, Hypertension Management, Lipid Management Is Patient Diabetic? Yes Pain Assessment Patient in pain? no      CBG Result 149 CBG Device ID A  Does patient need assistance? Functional Status Self care Ambulation Normal   CC:  follow-up visit 2 month DM...has been having drainage in back of throat x 1/12 week with some nasal congestion, Back Pain, Headache, Hypertension Management, and Lipid Management.  History of Present Illness:  Pt into the office for follow  up for diabetes  Diabetes - pt presents today with her blood sugar log. Pt is checking once per day    Back Pain History:            Other comments:  "it's always going to hurt me" "I deal with it" Takes hydrocodone for extreme back pain.    Diabetes Management History:      The patient is a 60 years old female who comes in for evaluation of Type 2 Diabetes Mellitus.  She has not been enrolled in the "Diabetic Education Program".  She states lack of understanding of dietary principles and is not following her diet appropriately.  No sensory loss is reported.  Self foot exams are not being performed.  She is checking home blood sugars.  She says that she is exercising.  Type of exercise includes: walking.  Duration of exercise is estimated to be <30  She is doing this 5 times per week.        Her home blood sugars include fasting blood sugars: highest: 366, lowest: 139.    Headache HPI:      She  has approximately 2 headaches per month.  There is a family history of migraine headaches.        On a scale of 1-10, she describes the headache as a 7.  The headaches are not associated with an aura.  The location of the headaches are bilateral-starts right.  Headache quality is sharp (knife-like).  The headaches are associated with vomiting and photophobia.        Additional history: 2 headaches in the past month. PMH of migraines but none in the last 6 months No current meds. Took some excedrin migraine which did help some with the symptoms.  Previously took imitrex for migraines.    Hypertension History:      She complains of headache, but denies chest pain and palpitations.  She notes no problems with any antihypertensive medication side effects.  Pt has her BP log with her today. she is checking every other day.  Further comments include: BP is tolerating well.        Positive major cardiovascular risk factors include female age 3 years old or older, diabetes, hyperlipidemia, and hypertension.  Negative major cardiovascular risk factors include negative family history for ischemic heart disease and non-tobacco-user status.  Further assessment for target organ damage reveals no history of ASHD, cardiac end-organ damage (CHF/LVH), stroke/TIA, peripheral vascular disease, renal insufficiency, or hypertensive retinopathy.    Lipid Management History:      Positive NCEP/ATP III risk factors include female age 50 years old or older, diabetes, HDL cholesterol less than 40, and hypertension.  Negative NCEP/ATP III risk factors include no family history for ischemic heart disease, non-tobacco-user status, no ASHD (atherosclerotic heart disease), no prior stroke/TIA, no peripheral vascular disease, and no history of aortic aneurysm.        The patient states that she knows about the "Therapeutic Lifestyle Change" diet.  Her compliance with the TLC diet is fair.  She expresses no side effects from her  lipid-lowering medication.  Comments include: pt is taking meds as ordered.  The patient denies any symptoms to suggest myopathy or liver disease.       Medications Prior to Update: 1)  Levothroid 112 Mcg  Tabs (Levothyroxine Sodium) .Marland Kitchen.. 1 Tablet By Mouth Daily For Thyroid 2)  Lyrica 150 Mg  Caps (Pregabalin) .Marland Kitchen.. 1 By Mouth Two Times A Day 3)  Lisinopril 20 Mg  Tabs (Lisinopril) .Marland Kitchen.. 1 Tablet By Mouth Twice Daily For Blood Pressure 4)  Janumet 50-500 Mg Tabs (Sitagliptin-Metformin Hcl) .... One Tablet By Mouth Two Times A Day For Diabetes 5)  Nexium 40 Mg  Cpdr (Esomeprazole Magnesium) .Marland Kitchen.. 1 Tablet By Mouth Daily For Stomach 6)  Lexapro 10 Mg  Tabs (Escitalopram Oxalate) .Marland Kitchen.. 1 Tablet By Mouth Daily For Mood 7)  Oxybutynin Chloride 5 Mg  Tb24 (Oxybutynin Chloride) .Marland Kitchen.. 1 Tablet By Mouth Two Times A Day 8)  Flexeril 10 Mg  Tabs (Cyclobenzaprine Hcl) .Marland Kitchen.. 1 Tablet By Mouth Nightly As Needed For Back 9)  Norvasc 2.5 Mg  Tabs (Amlodipine Besylate) .Marland Kitchen.. 1 Tablet By Mouth Daily 10)  Aspirin Ec Low Dose 81 Mg  Tbec (Aspirin) .Marland Kitchen.. 1 Tablet By Mouth Daily 11)  Tricor 145 Mg  Tabs (Fenofibrate) .Marland Kitchen.. 1 Tablet By Mouth Daily For Cholesterol 12)  Requip 1 Mg Tabs (Ropinirole Hcl) .... One Tablet By Mouth Nightly For Cramps 13)  Lovaza 1 Gm Caps (Omega-3-Acid Ethyl Esters) .... 2 Capusules By Mouth Two Times A Day For Cholesterol 14)  Black Cohosh Root 540 Mg Caps (Black Cohosh) .... Take Two Tablets Daily 15)  Vitamin C Cr 500 Mg Cr-Caps (Ascorbic Acid) .... Take One Tablet By Mouth Daily 16)  Proventil Hfa 108 (90 Base) Mcg/act Aers (Albuterol Sulfate) .... Use 2 Puffs Every 4-6 Hours For Cough 17)  High Potency Calcium 333.4-133 Mg-Unit Tabs (Calcium Carbonate-Vit D-Min) .... One Tab Daily 18)  Lantus Solostar 100 Unit/ml Soln (Insulin Glargine) .... 50 Units Subcutaneously Nightly For Diabetes **note Change in Dose** 19)  Pen Needles 29g X 12mm Misc (Insulin Pen Needle) .... Use With Lantus Pen At  Night 20)  Sulfamethoxazole-Tmp Ds 800-160 Mg Tabs (Sulfamethoxazole-Trimethoprim) .... One Tablet By Mouth Two Times A Day For Infection 21)  Lortab 5 5-500 Mg Tabs (Hydrocodone-Acetaminophen) .... One Tablet By Mouth Daily As Needed For Pain  Allergies (verified): No Known Drug Allergies  Review of Systems General:  Complains of sleep disorder; No naps during the day but about every 2 weeks she has periods when she is not able to go to sleep.  She takes some otc meds which helps then for the next 2 weeks she can sleep.  admits that she has some increased stressors. Eyes:  Complains of blurring; eye exam  is due on 11/2009. ENT:  Complains of nasal congestion and sore throat. CV:  Denies chest pain or discomfort. MS:  Complains of low back pain. Neuro:  Complains of headaches and tingling; migraines getting worse right foot 3rd, 4th, 5th toes are tingling.  Physical Exam  General:  alert.   Head:  normocephalic.   Eyes:  pupils round.   Mouth:  upper and lower lip with white peeling exudate Lungs:  normal breath sounds.   Heart:  normal rate and regular rhythm.    Diabetes Management Exam:    Foot Exam (with socks and/or shoes not present):       Sensory-Monofilament:          Left foot: normal          Right foot: normal   Impression & Recommendations:  Problem # 1:  DIABETES MELLITUS, TYPE II (ICD-250.00) still uncontrolled will increase lantus to 55 units Her updated medication list for this problem includes:    Lisinopril 20 Mg Tabs (Lisinopril) .Marland Kitchen... 1 tablet by mouth twice daily for blood pressure    Janumet 50-500 Mg Tabs (Sitagliptin-metformin hcl) ..... One tablet by mouth two times a day for diabetes    Aspirin Ec Low Dose 81 Mg Tbec (Aspirin) .Marland Kitchen... 1 tablet by mouth daily    Lantus Solostar 100 Unit/ml Soln (Insulin glargine) .Marland KitchenMarland KitchenMarland KitchenMarland Kitchen 55 units subcutaneously nightly for diabetes **note change in dose**  Orders: Capillary Blood Glucose/CBG (82948) Hemoglobin A1C  (83036)  Problem # 2:  SLEEP DISORDER (ICD-780.50) most likely due to stress advised pt to use relaxation techniques to induce better sleep habits  Problem # 3:  MIGRAINE HEADACHE (ICD-346.90) imitrex as needed for headache Her updated medication list for this problem includes:    Aspirin Ec Low Dose 81 Mg Tbec (Aspirin) .Marland Kitchen... 1 tablet by mouth daily    Lortab 5 5-500 Mg Tabs (Hydrocodone-acetaminophen) ..... One tablet by mouth daily as needed for pain    Imitrex 100 Mg Tabs (Sumatriptan succinate) ..... One tablet by mouth at onset of headache. may repeat in 2 hours if headache persists  Problem # 4:  CANDIDIASIS OF MOUTH (ICD-112.0)  swish with nystatin swish and spit  Complete Medication List: 1)  Levothroid 112 Mcg Tabs (Levothyroxine sodium) .Marland Kitchen.. 1 tablet by mouth daily for thyroid 2)  Lyrica 150 Mg Caps (Pregabalin) .Marland Kitchen.. 1 by mouth two times a day 3)  Lisinopril 20 Mg Tabs (Lisinopril) .Marland Kitchen.. 1 tablet by mouth twice daily for blood pressure 4)  Janumet 50-500 Mg Tabs (Sitagliptin-metformin hcl) .... One tablet by mouth two times a day for diabetes 5)  Nexium 40 Mg Cpdr (Esomeprazole magnesium) .Marland Kitchen.. 1 tablet by mouth daily for stomach 6)  Lexapro 10 Mg Tabs (Escitalopram oxalate) .Marland Kitchen.. 1 tablet by mouth daily for mood 7)  Oxybutynin Chloride 5 Mg Tb24 (Oxybutynin chloride) .Marland Kitchen.. 1 tablet by mouth two times a day 8)  Norvasc 2.5 Mg Tabs (Amlodipine besylate) .Marland Kitchen.. 1 tablet by mouth daily 9)  Aspirin Ec Low Dose 81 Mg Tbec (Aspirin) .Marland Kitchen.. 1 tablet by mouth daily 10)  Tricor 145 Mg Tabs (Fenofibrate) .Marland Kitchen.. 1 tablet by mouth daily for cholesterol 11)  Requip 1 Mg Tabs (Ropinirole hcl) .... One tablet by mouth nightly for cramps 12)  Lovaza 1 Gm Caps (Omega-3-acid ethyl esters) .... 2 capusules by mouth two times a day for cholesterol 13)  Black Cohosh Root 540 Mg Caps (Black cohosh) .... Take two tablets daily 14)  Vitamin C Cr 500  Mg Cr-caps (Ascorbic acid) .... Take one tablet by mouth  daily 15)  Proventil Hfa 108 (90 Base) Mcg/act Aers (Albuterol sulfate) .... Use 2 puffs every 4-6 hours for cough 16)  High Potency Calcium 333.4-133 Mg-unit Tabs (Calcium carbonate-vit d-min) .... One tab daily 17)  Lantus Solostar 100 Unit/ml Soln (Insulin glargine) .... 55 units subcutaneously nightly for diabetes **note change in dose** 18)  Pen Needles 29g X 12mm Misc (Insulin pen needle) .... Use with lantus pen at night 19)  Lortab 5 5-500 Mg Tabs (Hydrocodone-acetaminophen) .... One tablet by mouth daily as needed for pain 20)  Nystatin 100000 Unit/ml Susp (Nystatin) .... 5 ml by mouth four times per day swish and spit 21)  Imitrex 100 Mg Tabs (Sumatriptan succinate) .... One tablet by mouth at onset of headache. may repeat in 2 hours if headache persists  Diabetes Management Assessment/Plan:      The following lipid goals have been established for the patient: Total cholesterol goal of 200; LDL cholesterol goal of 100; HDL cholesterol goal of 40; Triglyceride goal of 150.  Her blood pressure goal is < 130/80.    Hypertension Assessment/Plan:      The patient's hypertensive risk group is category C: Target organ damage and/or diabetes.  Her calculated 10 year risk of coronary heart disease is 15 %.  Today's blood pressure is 120/73.  Her blood pressure goal is < 130/80.  Lipid Assessment/Plan:      Based on NCEP/ATP III, the patient's risk factor category is "history of diabetes".  The patient's lipid goals are as follows: Total cholesterol goal is 200; LDL cholesterol goal is 100; HDL cholesterol goal is 40; Triglyceride goal is 150.  Her LDL cholesterol goal has not been met.    Diabetic Foot Exam Last Podiatry Exam Date: 10/19/2008 Foot Inspection Is there a history of a foot ulcer?              No Is there a foot ulcer now?              No Can the patient see the bottom of their feet?          Yes Are the shoes appropriate in style and fit?          Yes Is there swelling or an  abnormal foot shape?          No Are the toenails long?                No Are the toenails thick?                No Are the toenails ingrown?              No Is there heavy callous build-up?              No Is there pain in the calf muscle (Intermittent claudication) when walking?    NoIs there a claw toe deformity?              No Is there elevated skin temperature?            No Is there limited ankle dorsiflexion?            No Is there foot or ankle muscle weakness?            No  Diabetic Foot Care Education Pulse Check          Right Foot  Left Foot Dorsalis Pedis:        normal            normal    10-g (5.07) Semmes-Weinstein Monofilament Test Performed by: Levon Hedger          Right Foot          Left Foot Visual Inspection                 Patient Instructions: 1)  Diabetes - Your Hgba1c = 8.1 today 2)  Increase your insulin to 55 units at night 3)  Blood pressure - ok. continue current meds 4)  Mouth - use nystatin, apply to affected area with q-tip 5)  Follow up in 6-8 weeks for diabetes since your insulin is being adjusted. Prescriptions: LORTAB 5 5-500 MG TABS (HYDROCODONE-ACETAMINOPHEN) One tablet by mouth daily as needed for pain  #30 x 0   Entered and Authorized by:   Lehman Prom FNP   Signed by:   Lehman Prom FNP on 08/07/2009   Method used:   Print then Give to Patient   RxID:   [id number] IMITREX 100 MG TABS (SUMATRIPTAN SUCCINATE) One tablet by mouth at onset of headache. may repeat in 2 hours if headache persists  #10 x 0   Entered and Authorized by:   Lehman Prom FNP   Signed by:   Lehman Prom FNP on 08/07/2009   Method used:   Print then Give to Patient   RxID:   [id number] NYSTATIN 100000 UNIT/ML SUSP (NYSTATIN) 5 ml by mouth four times per day swish and spit  #26ml x 0   Entered and Authorized by:   Lehman Prom FNP   Signed by:   Lehman Prom FNP on 08/07/2009   Method used:   Print then Give to  Patient   RxID:   [id number]    Last LDL:                                                 64 (05/01/2009 9:47:00 PM)        Diabetic Foot Exam Last Podiatry Exam Date: 10/19/2008 Pulse Check          Right Foot          Left Foot Dorsalis Pedis:        normal            normal    10-g (5.07) Semmes-Weinstein Monofilament Test Performed by: Levon Hedger          Right Foot          Left Foot Visual Inspection               Test Control      normal         normal Site 1         normal         normal Site 2         normal         normal Site 3         normal         normal Site 4         normal         normal Site 5         normal  normal Site 6         normal         normal Site 7         normal         normal Site 8         normal         normal Site 9         normal         normal Site 10         normal         normal  Impression      normal         normal   Laboratory Results   Blood Tests   Date/Time Received: August 07, 2009 10:53 AM   HGBA1C: 8.1%   (Normal Range: Non-Diabetic - 3-6%   Control Diabetic - 6-8%) CBG Random:: 149      Prevention & Chronic Care Immunizations   Influenza vaccine: Fluvax 3+  (05/02/2009)    Tetanus booster: 06/16/2003: historical per pt    Pneumococcal vaccine: Pneumovax  (11/25/2007)  Colorectal Screening   Hemoccult: Not documented   Hemoccult action/deferral: Ordered  (05/01/2009)    Colonoscopy: One 6mm polyp in the transverse colon diverticulosis  (11/24/2003)   Colonoscopy action/deferral: Deferred  (05/01/2009)  Other Screening   Pap smear:  Specimen Adequacy: Satisfactory for evaluation.   Interpretation/Result:Negative for intraepithelial Lesion or Malignancy.     (05/01/2009)   Pap smear action/deferral: Ordered  (05/01/2009)   Pap smear due: 05/2011    Mammogram: 76098.0^MM BREAST SURGICAL SPECIMEN  (05/27/2009)   Mammogram action/deferral: Ordered  (05/01/2009)   Smoking status:  never  (05/29/2009)  Diabetes Mellitus   HgbA1C: 8.1  (08/07/2009)   HgbA1C action/deferral: Ordered  (05/01/2009)    Eye exam: normal  (05/24/2008)    Foot exam: yes  (08/07/2009)   Foot exam action/deferral: Do today   High risk foot: No  (05/01/2009)   Foot care education: Done  (05/01/2009)    Urine microalbumin/creatinine ratio: Not documented  Lipids   Total Cholesterol: 141  (05/01/2009)   Lipid panel action/deferral: Lipid Panel ordered   LDL: 64  (05/01/2009)   LDL Direct: Not documented   HDL: 35  (05/01/2009)   Triglycerides: 211  (05/01/2009)    SGOT (AST): 30  (05/01/2009)   SGPT (ALT): 29  (05/01/2009)   Alkaline phosphatase: 28  (05/01/2009)   Total bilirubin: 0.5  (05/01/2009)  Hypertension   Last Blood Pressure: 120 / 73  (08/07/2009)   Serum creatinine: 1.06  (05/01/2009)   Serum potassium 5.3  (05/01/2009)  Self-Management Support :    Diabetes self-management support: Not documented    Hypertension self-management support: Not documented    Lipid self-management support: Not documented

## 2010-07-15 NOTE — Letter (Signed)
Summary: Pre Visit Letter Revised  [facility]  [address] [place], [place] [address]   Phone: [phone]  Fax: [phone]        05/05/2010 MRN: [id number] Christy Baldwin [address] [place], [place]  [address]             Procedure Date:  06-13-11   Welcome to the Gastroenterology Division at [facility].    You are scheduled to see a nurse for your pre-procedure visit on 05-29-10 at 10:00a.m. on the 3rd floor at [facility], [address]  We ask that you try to arrive at our office 15 minutes prior to your appointment time to allow for check-in.  Please take a minute to review the attached form.  If you answer "Yes" to one or more of the questions on the first page, we ask that you call the person listed at your earliest opportunity.  If you answer "No" to all of the questions, please complete the rest of the form and bring it to your appointment.    Your nurse visit will consist of discussing your medical and surgical history, your immediate family medical history, and your medications.   If you are unable to list all of your medications on the form, please bring the medication bottles to your appointment and we will list them.  We will need to be aware of both prescribed and over the counter drugs.  We will need to know exact dosage information as well.    Please be prepared to read and sign documents such as consent forms, a financial agreement, and acknowledgement forms.  If necessary, and with your consent, a friend or relative is welcome to sit-in on the nurse visit with you.  Please bring your insurance card so that we may make a copy of it.  If your insurance requires a referral to see a specialist, please bring your referral form from your primary care physician.  No co-pay is required for this nurse visit.     If you cannot keep your appointment, please call [phone] to cancel or reschedule prior to your appointment date.  This allows [place]  the opportunity to schedule an appointment for another patient in need of care.    Thank you for choosing [facility] for your medical needs.  We appreciate the opportunity to care for you.  Please visit [place] at our website  to learn more about our practice.  Sincerely, The Gastroenterology Division

## 2010-07-15 NOTE — Progress Notes (Signed)
Summary: Needs an appt  Phone Note Outgoing Call   Summary of Call: notify pt that provider has noticed that she has not been into the office for her routine diabetes check.  it is also time for her flu vaccine Has she changed practices (if so, ok - i know she has medicare now so she may have other options) or is she still a pt here? If she is still a pt then she needs a f/u appt Initial call taken by: Lehman Prom FNP,  April 28, 2010 8:06 AM  Follow-up for Phone Call        Christy Baldwin  April 29, 2010 3:08 PM Left message on machine for pt to return call to the office.  Christy Baldwin  April 29, 2010 3:20 PM spoke with pt and she is beng seen at another practice.  she is also getting ready to move from [place], and she thanks you for being concerned.  Additional Follow-up for Phone Call Additional follow up Details #1::        noted Additional Follow-up by: Lehman Prom FNP,  April 30, 2010 8:40 AM

## 2010-07-15 NOTE — Letter (Signed)
Summary: PHYSICIAN'ORDER  PHYSICIAN'ORDER   Imported By: Arta Bruce 10/14/2009 15:13:13  _____________________________________________________________________  External Attachment:    Type:   Image     Comment:   External Document

## 2010-10-31 NOTE — Op Note (Signed)
NAMEJAKKI, Christy Baldwin                ACCOUNT NO.:  [id number]   MEDICAL RECORD NO.:  [id number]          PATIENT TYPE:  AMB   LOCATION:  DSC                          FACILITY:  [facility]   PHYSICIAN:  Dionne Ano. Gramig III, M.D.DATE OF BIRTH:  11-18-1950   DATE OF PROCEDURE:  12/18/2004  DATE OF DISCHARGE:                                 OPERATIVE REPORT   PREOPERATIVE DIAGNOSIS:  Carpal tunnel syndrome, right upper extremity.   POSTOPERATIVE DIAGNOSIS:  Carpal tunnel syndrome, right upper extremity.   PROCEDURE:  1.  Right median nerve/peripheral nerve block, ___________, carpal tunnel      release.  2.  Right __________ carpal tunnel release.   SURGEON:  Dionne Ano. Amanda Pea, M.D.   ASSISTANT:  Karie Chimera, P.A.-C.   COMPLICATIONS:  None.   ANESTHESIA:  Peripheral nerve block with IV sedation with the patient awake,  alert, and oriented the entire case.   ESTIMATED BLOOD LOSS:  Minimal.   TOURNIQUET TIME:  Less than 10 minutes.   INDICATIONS FOR PROCEDURE:  [name] is a very pleasant 60 year old female  who presents with the above mentioned diagnosis.  I have counseled her in  regards to the risks and benefits of surgery including the risks of  infection, bleeding, anesthesia, damage to normal structures, and failure of  surgery to accomplish its intended goals,  __________.  With this in mind,  she desires to proceed.  All questions have been encouraged and answered  preoperatively.   OPERATIVE PROCEDURE IN DETAIL:  The patient was seen by myself and  anesthesia and taken to the operating room suite and underwent smooth  induction of median nerve/peripheral nerve block __________, carpal tunnel  release.  She was given 1 of fentanyl and 1 of Versed for this and was kept  awake during this.  Following this, she was prepped and draped a second time  with Betadine scrub and paint, the arm was elevated, and tourniquet was  insufflated to 250 mmHg.  A 1 cm incision was made  through the transverse  carpal ligament and dissection was carried down, palmar fascia incised.  Distally, the transverse carpal ligament was identified __________ loupe  magnification.  Dissection was carried out and the fat pad egressed nicely.  Following this, distal to proximal dissection was carried out until adequate  room was available for __________ devices which were placed directly midline  without patient discomfort or problems.  Following this, I placed a security  blade in the security clip effectively releasing the proximal leaf of the  transverse carpal ligament.  The patient had all passes of the instruments  and the security blade and obturator placed and disengaged nicely.  There  were no complicating features and she was awake, alert, and oriented during  this.  The security knife was placed in the security clip and this  completely released the proximal leaflet of the transverse carpal ligament  as well as a small portion of the antebrachial fascia.  I checked the area.  She was completely relieved and looked quite well.  There were no obvious  masses,  space occupying lesions, or internal derangement noted.  The patient  did have a very thickened transverse carpal ligament which separated widely  upon its incision, I should note.  Following this, I irrigated copiously  with tourniquet deflated, obtained hemostasis about bleeding points with  bipolar cautery, and closed the wound with Prolene after copious irrigation.  Placement of a sterile bandage was applied.  She is taken to the recovery  room and will be monitored and discharged to home.  She will return to see  [place] in seven days, therapy in 14 days, and of course, notify us if any  problems occur.  All questions have been encouraged and answered.        WMG/MEDQ  D:  12/18/2004  T:  12/18/2004  Job:  [id number]

## 2010-10-31 NOTE — Op Note (Signed)
NAMEADALIAH, Christy Baldwin                ACCOUNT NO.:  [id number]   MEDICAL RECORD NO.:  [id number]          PATIENT TYPE:  AMB   LOCATION:  DSC                          FACILITY:  [facility]   PHYSICIAN:  Loreta Ave, M.D. 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   PROCEDURES:  1. Left knee exam under anesthesia.  2. Arthroscopy.  3. Chondroplasty.  4. Removal of loose bodies in all three compartments.  5. Partial medial and lateral meniscectomy.   SURGEON:  Loreta Ave, MD   ASSISTANT:  Zonia Kief, PA   ANESTHESIA:  General.   BLOOD LOSS:  Minimal.   TOURNIQUET:  Not employed.   SPECIMENS:  None.   CULTURES:  None.   COMPLICATIONS:  None.   DRESSING:  Sterile compressive.   PROCEDURE IN DETAIL:  The patient brought to the operating room and  placed on the operating table in the supine position.  After adequate  anesthesia had been obtained, tourniquet and leg holder were applied.  Leg prepped and draped in the usual sterile fashion.  Three portals were  created, one superolateral and one in each medial and lateral  parapatella.  Inflow catheter introduced, knee distended, arthroscope  introduced, knee inspected.  Chondral loose bodies throughout.  Grade II  change in the patellofemoral joint, reasonable tracking, chondroplasty.  Mucinous degeneration with a cyst at the base of the ACL debrided, still  reasonable and functional ACL tissue.  Medial compartment grade II and  III changes on the condyle.  Degenerative radial tears in the midline  posterior third were saucerized out to a stable margin and  tapered into  the remaining meniscus, salvaging some of the meniscus all the way  around.  Lateral compartment grade IV bone-on-bone both sides of the  joint.  Extensive complex tearing of what was left of the lateral  meniscus.  Completion of total lateral meniscectomy.  Chondroplasty of  all remaining  surfaces.  Entire knee examined to be sure all loose fragments removed.  Instruments and fluid removed.  Portals of the knee were injected with  Marcaine.  Portals closed with 4-0 nylon.  Sterile compressive dressing  applied.  Anesthesia reversed.  Brought to recovery room.  Tolerated  surgery well with no complications.      Loreta Ave, M.D.  Electronically Signed     DFM/MEDQ  D:  05/19/2006  T:  05/20/2006  Job:  [id number]

## 2010-10-31 NOTE — Op Note (Signed)
[place]  Patient:    Christy Baldwin, Christy Baldwin                       MRN: [id number] Proc. Date: 12/14/00 Adm. Date:  [id number] Attending:  Marcene Corning                           Operative Report  PREOPERATIVE DIAGNOSIS:  Left knee torn medial meniscus.  POSTOPERATIVE DIAGNOSIS: 1. Left knee torn medial meniscus. 2. Left knee torn lateral meniscus.  OPERATION PERFORMED: 1. Left knee partial medial and partial lateral meniscectomy.  ANESTHESIA:  General.  ATTENDING SURGEON:  Lubertha Basque. Jerl Santos, M.D.  INDICATIONS FOR PROCEDURE:  The patient is a 61 year old woman with many months of severe left knee pain.  This has persisted despite oral anti-inflammatories and activity restriction.  At this point she was offered an arthroscopy.  The procedure was discussed with the patient and informed operative consent was obtained after discussion of possible complications of reaction to anesthesia and infection.  DESCRIPTION OF PROCEDURE:  The patient was taken to an operating suite where general anesthetic was applied without difficulty.  She was then positioned supine and prepped and draped in normal sterile fashion.  After the administration of preop intravenous antibiotics, an arthroscopy of the left knee was performed through two inferior portals.  Suprapatellar pouch was benign as was the patellofemoral joint.  The medial compartment exhibited some fraying of the posterior horn of the medial meniscus which required about a 5% partial medial meniscectomy.  The ACL and the PCL were intact.  The lateral compartment exhibited a complex tear of the posterior horn with an unstable bucket handle portion.  This was addressed with a partial lateral meniscectomy removing about 40% of this structure back to a stable rim.  There were minimal if any degenerative changes in any of the three compartments and the patellofemoral joint looked benign.  The knee was  thoroughly irrigated at the end of the case followed by placement of Marcaine with epinephrine and morphine.  Adaptic was placed over the portals followed by dry gauze and a loose Ace wrap.  Estimated blood loss and intraoperative fluids can be obtained from Anesthesia records.  DISPOSITION:  The patient was extubated in the operating room and taken to the recovery room in stable condition.  Plans were for her to go home the same day and to follow up in the office in less than a week.  I will contact her by phone tonight. DD:  12/14/00 TD:  12/14/00 Job: [id number]

## 2014-09-17 ENCOUNTER — Other Ambulatory Visit: Payer: Self-pay | Admitting: Internal Medicine

## 2014-09-17 DIAGNOSIS — R945 Abnormal results of liver function studies: Secondary | ICD-10-CM

## 2014-09-24 ENCOUNTER — Ambulatory Visit
Admission: RE | Admit: 2014-09-24 | Discharge: 2014-09-24 | Disposition: A | Discharge status: Home / Self Care | Payer: Self-pay | Source: Ambulatory Visit | Attending: Internal Medicine | Admitting: Internal Medicine

## 2014-09-24 DIAGNOSIS — R945 Abnormal results of liver function studies: Secondary | ICD-10-CM

## 2016-06-25 DIAGNOSIS — I10 Essential (primary) hypertension: Secondary | ICD-10-CM | POA: Diagnosis not present

## 2016-06-25 DIAGNOSIS — E039 Hypothyroidism, unspecified: Secondary | ICD-10-CM | POA: Diagnosis not present

## 2016-06-25 DIAGNOSIS — E119 Type 2 diabetes mellitus without complications: Secondary | ICD-10-CM | POA: Diagnosis not present

## 2016-07-06 DIAGNOSIS — R9431 Abnormal electrocardiogram [ECG] [EKG]: Secondary | ICD-10-CM | POA: Diagnosis not present

## 2016-07-06 DIAGNOSIS — I10 Essential (primary) hypertension: Secondary | ICD-10-CM | POA: Diagnosis not present

## 2016-07-09 DIAGNOSIS — R9431 Abnormal electrocardiogram [ECG] [EKG]: Secondary | ICD-10-CM | POA: Diagnosis not present

## 2016-07-09 DIAGNOSIS — I10 Essential (primary) hypertension: Secondary | ICD-10-CM | POA: Diagnosis not present

## 2016-07-16 DIAGNOSIS — I10 Essential (primary) hypertension: Secondary | ICD-10-CM | POA: Diagnosis not present

## 2016-07-16 DIAGNOSIS — E119 Type 2 diabetes mellitus without complications: Secondary | ICD-10-CM | POA: Diagnosis not present

## 2016-07-16 DIAGNOSIS — E039 Hypothyroidism, unspecified: Secondary | ICD-10-CM | POA: Diagnosis not present

## 2016-07-16 DIAGNOSIS — R69 Illness, unspecified: Secondary | ICD-10-CM | POA: Diagnosis not present

## 2016-07-20 DIAGNOSIS — L819 Disorder of pigmentation, unspecified: Secondary | ICD-10-CM | POA: Diagnosis not present

## 2016-07-20 DIAGNOSIS — J309 Allergic rhinitis, unspecified: Secondary | ICD-10-CM | POA: Diagnosis not present

## 2016-07-20 DIAGNOSIS — Z0000 Encounter for general adult medical examination without abnormal findings: Secondary | ICD-10-CM | POA: Diagnosis not present

## 2016-07-20 DIAGNOSIS — J019 Acute sinusitis, unspecified: Secondary | ICD-10-CM | POA: Diagnosis not present

## 2016-07-20 DIAGNOSIS — E1142 Type 2 diabetes mellitus with diabetic polyneuropathy: Secondary | ICD-10-CM | POA: Diagnosis not present

## 2016-07-20 DIAGNOSIS — E669 Obesity, unspecified: Secondary | ICD-10-CM | POA: Diagnosis not present

## 2016-07-20 DIAGNOSIS — R69 Illness, unspecified: Secondary | ICD-10-CM | POA: Diagnosis not present

## 2016-07-20 DIAGNOSIS — K219 Gastro-esophageal reflux disease without esophagitis: Secondary | ICD-10-CM | POA: Diagnosis not present

## 2016-07-20 DIAGNOSIS — I10 Essential (primary) hypertension: Secondary | ICD-10-CM | POA: Diagnosis not present

## 2016-07-20 DIAGNOSIS — E039 Hypothyroidism, unspecified: Secondary | ICD-10-CM | POA: Diagnosis not present

## 2016-08-19 DIAGNOSIS — J329 Chronic sinusitis, unspecified: Secondary | ICD-10-CM | POA: Diagnosis not present

## 2016-09-05 DIAGNOSIS — E119 Type 2 diabetes mellitus without complications: Secondary | ICD-10-CM | POA: Diagnosis not present

## 2016-09-24 DIAGNOSIS — J069 Acute upper respiratory infection, unspecified: Secondary | ICD-10-CM | POA: Diagnosis not present

## 2016-09-24 DIAGNOSIS — J111 Influenza due to unidentified influenza virus with other respiratory manifestations: Secondary | ICD-10-CM | POA: Diagnosis not present

## 2016-10-08 DIAGNOSIS — N3001 Acute cystitis with hematuria: Secondary | ICD-10-CM | POA: Diagnosis not present

## 2016-10-08 DIAGNOSIS — J069 Acute upper respiratory infection, unspecified: Secondary | ICD-10-CM | POA: Diagnosis not present

## 2016-10-08 DIAGNOSIS — R509 Fever, unspecified: Secondary | ICD-10-CM | POA: Diagnosis not present

## 2016-10-08 DIAGNOSIS — D469 Myelodysplastic syndrome, unspecified: Secondary | ICD-10-CM | POA: Diagnosis not present

## 2016-10-08 DIAGNOSIS — N390 Urinary tract infection, site not specified: Secondary | ICD-10-CM | POA: Diagnosis not present

## 2016-10-21 DIAGNOSIS — E119 Type 2 diabetes mellitus without complications: Secondary | ICD-10-CM | POA: Diagnosis not present

## 2016-10-21 DIAGNOSIS — I10 Essential (primary) hypertension: Secondary | ICD-10-CM | POA: Diagnosis not present

## 2016-10-21 DIAGNOSIS — R8299 Other abnormal findings in urine: Secondary | ICD-10-CM | POA: Diagnosis not present

## 2016-10-28 DIAGNOSIS — F419 Anxiety disorder, unspecified: Secondary | ICD-10-CM | POA: Diagnosis not present

## 2016-10-28 DIAGNOSIS — E119 Type 2 diabetes mellitus without complications: Secondary | ICD-10-CM | POA: Diagnosis not present

## 2016-10-28 DIAGNOSIS — I10 Essential (primary) hypertension: Secondary | ICD-10-CM | POA: Diagnosis not present

## 2016-10-28 DIAGNOSIS — E039 Hypothyroidism, unspecified: Secondary | ICD-10-CM | POA: Diagnosis not present

## 2016-11-16 DIAGNOSIS — E7800 Pure hypercholesterolemia, unspecified: Secondary | ICD-10-CM | POA: Diagnosis not present

## 2016-11-16 DIAGNOSIS — E039 Hypothyroidism, unspecified: Secondary | ICD-10-CM | POA: Diagnosis not present

## 2016-11-16 DIAGNOSIS — I10 Essential (primary) hypertension: Secondary | ICD-10-CM | POA: Diagnosis not present

## 2016-11-16 DIAGNOSIS — E1165 Type 2 diabetes mellitus with hyperglycemia: Secondary | ICD-10-CM | POA: Diagnosis not present

## 2017-01-21 DIAGNOSIS — E1165 Type 2 diabetes mellitus with hyperglycemia: Secondary | ICD-10-CM | POA: Diagnosis not present

## 2017-01-21 DIAGNOSIS — E7800 Pure hypercholesterolemia, unspecified: Secondary | ICD-10-CM | POA: Diagnosis not present

## 2017-01-21 DIAGNOSIS — I10 Essential (primary) hypertension: Secondary | ICD-10-CM | POA: Diagnosis not present

## 2017-01-27 DIAGNOSIS — E119 Type 2 diabetes mellitus without complications: Secondary | ICD-10-CM | POA: Diagnosis not present

## 2017-01-27 DIAGNOSIS — F419 Anxiety disorder, unspecified: Secondary | ICD-10-CM | POA: Diagnosis not present

## 2017-01-27 DIAGNOSIS — E039 Hypothyroidism, unspecified: Secondary | ICD-10-CM | POA: Diagnosis not present

## 2017-01-27 DIAGNOSIS — I10 Essential (primary) hypertension: Secondary | ICD-10-CM | POA: Diagnosis not present

## 2017-03-18 DIAGNOSIS — I10 Essential (primary) hypertension: Secondary | ICD-10-CM | POA: Diagnosis not present

## 2017-03-18 DIAGNOSIS — R0683 Snoring: Secondary | ICD-10-CM | POA: Diagnosis not present

## 2017-03-18 DIAGNOSIS — G629 Polyneuropathy, unspecified: Secondary | ICD-10-CM | POA: Diagnosis not present

## 2017-03-18 DIAGNOSIS — E039 Hypothyroidism, unspecified: Secondary | ICD-10-CM | POA: Diagnosis not present

## 2017-03-18 DIAGNOSIS — E1165 Type 2 diabetes mellitus with hyperglycemia: Secondary | ICD-10-CM | POA: Diagnosis not present

## 2017-04-02 DIAGNOSIS — S61210A Laceration without foreign body of right index finger without damage to nail, initial encounter: Secondary | ICD-10-CM | POA: Diagnosis not present

## 2017-04-02 DIAGNOSIS — W268XXA Contact with other sharp object(s), not elsewhere classified, initial encounter: Secondary | ICD-10-CM | POA: Diagnosis not present

## 2017-04-02 DIAGNOSIS — I10 Essential (primary) hypertension: Secondary | ICD-10-CM | POA: Diagnosis not present

## 2017-04-02 DIAGNOSIS — J019 Acute sinusitis, unspecified: Secondary | ICD-10-CM | POA: Diagnosis not present

## 2017-04-02 DIAGNOSIS — J45909 Unspecified asthma, uncomplicated: Secondary | ICD-10-CM | POA: Diagnosis not present

## 2017-04-15 DIAGNOSIS — E118 Type 2 diabetes mellitus with unspecified complications: Secondary | ICD-10-CM | POA: Diagnosis not present

## 2017-04-15 DIAGNOSIS — E559 Vitamin D deficiency, unspecified: Secondary | ICD-10-CM | POA: Diagnosis not present

## 2017-04-15 DIAGNOSIS — E7800 Pure hypercholesterolemia, unspecified: Secondary | ICD-10-CM | POA: Diagnosis not present

## 2017-04-15 DIAGNOSIS — I10 Essential (primary) hypertension: Secondary | ICD-10-CM | POA: Diagnosis not present

## 2017-04-15 DIAGNOSIS — E039 Hypothyroidism, unspecified: Secondary | ICD-10-CM | POA: Diagnosis not present

## 2017-04-22 DIAGNOSIS — R0681 Apnea, not elsewhere classified: Secondary | ICD-10-CM | POA: Diagnosis not present

## 2017-04-22 DIAGNOSIS — F329 Major depressive disorder, single episode, unspecified: Secondary | ICD-10-CM | POA: Diagnosis not present

## 2017-04-22 DIAGNOSIS — R0683 Snoring: Secondary | ICD-10-CM | POA: Diagnosis not present

## 2017-04-22 DIAGNOSIS — R7989 Other specified abnormal findings of blood chemistry: Secondary | ICD-10-CM | POA: Diagnosis not present

## 2017-04-22 DIAGNOSIS — E039 Hypothyroidism, unspecified: Secondary | ICD-10-CM | POA: Diagnosis not present

## 2017-04-22 DIAGNOSIS — I10 Essential (primary) hypertension: Secondary | ICD-10-CM | POA: Diagnosis not present

## 2017-04-22 DIAGNOSIS — Z23 Encounter for immunization: Secondary | ICD-10-CM | POA: Diagnosis not present

## 2017-04-22 DIAGNOSIS — E119 Type 2 diabetes mellitus without complications: Secondary | ICD-10-CM | POA: Diagnosis not present

## 2017-04-22 DIAGNOSIS — G43909 Migraine, unspecified, not intractable, without status migrainosus: Secondary | ICD-10-CM | POA: Diagnosis not present

## 2017-04-22 DIAGNOSIS — R400 Somnolence: Secondary | ICD-10-CM | POA: Diagnosis not present

## 2017-04-23 DIAGNOSIS — E119 Type 2 diabetes mellitus without complications: Secondary | ICD-10-CM | POA: Diagnosis not present

## 2017-04-23 DIAGNOSIS — H2513 Age-related nuclear cataract, bilateral: Secondary | ICD-10-CM | POA: Diagnosis not present

## 2017-06-02 DIAGNOSIS — J0110 Acute frontal sinusitis, unspecified: Secondary | ICD-10-CM | POA: Diagnosis not present

## 2017-06-02 DIAGNOSIS — I10 Essential (primary) hypertension: Secondary | ICD-10-CM | POA: Diagnosis not present

## 2017-06-02 DIAGNOSIS — R490 Dysphonia: Secondary | ICD-10-CM | POA: Diagnosis not present

## 2017-06-02 DIAGNOSIS — R05 Cough: Secondary | ICD-10-CM | POA: Diagnosis not present

## 2017-06-02 DIAGNOSIS — J45909 Unspecified asthma, uncomplicated: Secondary | ICD-10-CM | POA: Diagnosis not present

## 2017-06-02 DIAGNOSIS — E119 Type 2 diabetes mellitus without complications: Secondary | ICD-10-CM | POA: Diagnosis not present

## 2017-07-15 DIAGNOSIS — I10 Essential (primary) hypertension: Secondary | ICD-10-CM | POA: Diagnosis not present

## 2017-07-15 DIAGNOSIS — E1142 Type 2 diabetes mellitus with diabetic polyneuropathy: Secondary | ICD-10-CM | POA: Diagnosis not present

## 2017-07-15 DIAGNOSIS — E039 Hypothyroidism, unspecified: Secondary | ICD-10-CM | POA: Diagnosis not present

## 2017-07-15 DIAGNOSIS — J3489 Other specified disorders of nose and nasal sinuses: Secondary | ICD-10-CM | POA: Diagnosis not present

## 2017-07-15 DIAGNOSIS — J45909 Unspecified asthma, uncomplicated: Secondary | ICD-10-CM | POA: Diagnosis not present

## 2017-07-15 DIAGNOSIS — R509 Fever, unspecified: Secondary | ICD-10-CM | POA: Diagnosis not present

## 2017-07-15 DIAGNOSIS — R05 Cough: Secondary | ICD-10-CM | POA: Diagnosis not present

## 2017-07-15 DIAGNOSIS — J069 Acute upper respiratory infection, unspecified: Secondary | ICD-10-CM | POA: Diagnosis not present

## 2017-07-15 DIAGNOSIS — Z794 Long term (current) use of insulin: Secondary | ICD-10-CM | POA: Diagnosis not present

## 2017-07-15 DIAGNOSIS — R0981 Nasal congestion: Secondary | ICD-10-CM | POA: Diagnosis not present

## 2017-07-15 DIAGNOSIS — F329 Major depressive disorder, single episode, unspecified: Secondary | ICD-10-CM | POA: Diagnosis not present

## 2017-07-15 DIAGNOSIS — J029 Acute pharyngitis, unspecified: Secondary | ICD-10-CM | POA: Diagnosis not present

## 2017-07-15 DIAGNOSIS — E119 Type 2 diabetes mellitus without complications: Secondary | ICD-10-CM | POA: Diagnosis not present

## 2017-07-15 DIAGNOSIS — E7800 Pure hypercholesterolemia, unspecified: Secondary | ICD-10-CM | POA: Diagnosis not present

## 2017-07-15 DIAGNOSIS — E1165 Type 2 diabetes mellitus with hyperglycemia: Secondary | ICD-10-CM | POA: Diagnosis not present

## 2017-07-15 DIAGNOSIS — R0982 Postnasal drip: Secondary | ICD-10-CM | POA: Diagnosis not present

## 2017-07-22 DIAGNOSIS — E1165 Type 2 diabetes mellitus with hyperglycemia: Secondary | ICD-10-CM | POA: Diagnosis not present

## 2017-07-22 DIAGNOSIS — E119 Type 2 diabetes mellitus without complications: Secondary | ICD-10-CM | POA: Diagnosis not present

## 2017-07-22 DIAGNOSIS — E1142 Type 2 diabetes mellitus with diabetic polyneuropathy: Secondary | ICD-10-CM | POA: Diagnosis not present

## 2017-07-22 DIAGNOSIS — E039 Hypothyroidism, unspecified: Secondary | ICD-10-CM | POA: Diagnosis not present

## 2017-07-22 DIAGNOSIS — I10 Essential (primary) hypertension: Secondary | ICD-10-CM | POA: Diagnosis not present

## 2017-08-02 DIAGNOSIS — E039 Hypothyroidism, unspecified: Secondary | ICD-10-CM | POA: Diagnosis not present

## 2017-08-09 DIAGNOSIS — E1142 Type 2 diabetes mellitus with diabetic polyneuropathy: Secondary | ICD-10-CM | POA: Diagnosis not present

## 2017-08-09 DIAGNOSIS — I10 Essential (primary) hypertension: Secondary | ICD-10-CM | POA: Diagnosis not present

## 2017-08-09 DIAGNOSIS — J029 Acute pharyngitis, unspecified: Secondary | ICD-10-CM | POA: Diagnosis not present

## 2017-08-09 DIAGNOSIS — E7800 Pure hypercholesterolemia, unspecified: Secondary | ICD-10-CM | POA: Diagnosis not present

## 2017-08-09 DIAGNOSIS — F329 Major depressive disorder, single episode, unspecified: Secondary | ICD-10-CM | POA: Diagnosis not present

## 2017-08-09 DIAGNOSIS — R197 Diarrhea, unspecified: Secondary | ICD-10-CM | POA: Diagnosis not present

## 2017-08-09 DIAGNOSIS — E119 Type 2 diabetes mellitus without complications: Secondary | ICD-10-CM | POA: Diagnosis not present

## 2017-08-09 DIAGNOSIS — R112 Nausea with vomiting, unspecified: Secondary | ICD-10-CM | POA: Diagnosis not present

## 2017-08-09 DIAGNOSIS — E1165 Type 2 diabetes mellitus with hyperglycemia: Secondary | ICD-10-CM | POA: Diagnosis not present

## 2017-08-09 DIAGNOSIS — E039 Hypothyroidism, unspecified: Secondary | ICD-10-CM | POA: Diagnosis not present

## 2017-08-23 DIAGNOSIS — E039 Hypothyroidism, unspecified: Secondary | ICD-10-CM | POA: Diagnosis not present

## 2017-08-23 DIAGNOSIS — E119 Type 2 diabetes mellitus without complications: Secondary | ICD-10-CM | POA: Diagnosis not present

## 2017-08-23 DIAGNOSIS — E1142 Type 2 diabetes mellitus with diabetic polyneuropathy: Secondary | ICD-10-CM | POA: Diagnosis not present

## 2017-08-23 DIAGNOSIS — E1165 Type 2 diabetes mellitus with hyperglycemia: Secondary | ICD-10-CM | POA: Diagnosis not present

## 2017-08-23 DIAGNOSIS — F329 Major depressive disorder, single episode, unspecified: Secondary | ICD-10-CM | POA: Diagnosis not present

## 2017-08-23 DIAGNOSIS — I10 Essential (primary) hypertension: Secondary | ICD-10-CM | POA: Diagnosis not present

## 2017-08-23 DIAGNOSIS — E7800 Pure hypercholesterolemia, unspecified: Secondary | ICD-10-CM | POA: Diagnosis not present

## 2017-08-30 ENCOUNTER — Ambulatory Visit
Admission: RE | Admit: 2017-08-30 | Discharge: 2017-08-30 | Disposition: A | Discharge status: Home / Self Care | Payer: Medicare Other | Source: Ambulatory Visit | Attending: Internal Medicine | Admitting: Internal Medicine

## 2017-08-30 ENCOUNTER — Other Ambulatory Visit: Payer: Self-pay | Admitting: Internal Medicine

## 2017-08-30 DIAGNOSIS — Z1231 Encounter for screening mammogram for malignant neoplasm of breast: Secondary | ICD-10-CM

## 2017-08-31 DIAGNOSIS — G43709 Chronic migraine without aura, not intractable, without status migrainosus: Secondary | ICD-10-CM | POA: Diagnosis not present

## 2017-08-31 DIAGNOSIS — R51 Headache: Secondary | ICD-10-CM | POA: Diagnosis not present

## 2017-09-06 DIAGNOSIS — R51 Headache: Secondary | ICD-10-CM | POA: Diagnosis not present

## 2017-09-06 DIAGNOSIS — Z8249 Family history of ischemic heart disease and other diseases of the circulatory system: Secondary | ICD-10-CM | POA: Diagnosis not present

## 2017-09-15 DIAGNOSIS — Z801 Family history of malignant neoplasm of trachea, bronchus and lung: Secondary | ICD-10-CM | POA: Diagnosis not present

## 2017-09-15 DIAGNOSIS — I10 Essential (primary) hypertension: Secondary | ICD-10-CM | POA: Diagnosis not present

## 2017-09-15 DIAGNOSIS — F330 Major depressive disorder, recurrent, mild: Secondary | ICD-10-CM | POA: Diagnosis not present

## 2017-09-15 DIAGNOSIS — F411 Generalized anxiety disorder: Secondary | ICD-10-CM | POA: Diagnosis not present

## 2017-09-15 DIAGNOSIS — E0590 Thyrotoxicosis, unspecified without thyrotoxic crisis or storm: Secondary | ICD-10-CM | POA: Diagnosis not present

## 2017-09-15 DIAGNOSIS — G894 Chronic pain syndrome: Secondary | ICD-10-CM | POA: Diagnosis not present

## 2017-09-15 DIAGNOSIS — R52 Pain, unspecified: Secondary | ICD-10-CM | POA: Diagnosis not present

## 2017-09-15 DIAGNOSIS — Z808 Family history of malignant neoplasm of other organs or systems: Secondary | ICD-10-CM | POA: Diagnosis not present

## 2017-09-15 DIAGNOSIS — Z807 Family history of other malignant neoplasms of lymphoid, hematopoietic and related tissues: Secondary | ICD-10-CM | POA: Diagnosis not present

## 2017-12-06 DIAGNOSIS — E1165 Type 2 diabetes mellitus with hyperglycemia: Secondary | ICD-10-CM | POA: Diagnosis not present

## 2017-12-06 DIAGNOSIS — F329 Major depressive disorder, single episode, unspecified: Secondary | ICD-10-CM | POA: Diagnosis not present

## 2017-12-06 DIAGNOSIS — E039 Hypothyroidism, unspecified: Secondary | ICD-10-CM | POA: Diagnosis not present

## 2017-12-06 DIAGNOSIS — I10 Essential (primary) hypertension: Secondary | ICD-10-CM | POA: Diagnosis not present

## 2017-12-07 DIAGNOSIS — G43709 Chronic migraine without aura, not intractable, without status migrainosus: Secondary | ICD-10-CM | POA: Diagnosis not present

## 2017-12-28 DIAGNOSIS — E7800 Pure hypercholesterolemia, unspecified: Secondary | ICD-10-CM | POA: Diagnosis not present

## 2017-12-28 DIAGNOSIS — E039 Hypothyroidism, unspecified: Secondary | ICD-10-CM | POA: Diagnosis not present

## 2017-12-28 DIAGNOSIS — E559 Vitamin D deficiency, unspecified: Secondary | ICD-10-CM | POA: Diagnosis not present

## 2017-12-28 DIAGNOSIS — E1165 Type 2 diabetes mellitus with hyperglycemia: Secondary | ICD-10-CM | POA: Diagnosis not present

## 2017-12-31 DIAGNOSIS — E559 Vitamin D deficiency, unspecified: Secondary | ICD-10-CM | POA: Diagnosis not present

## 2017-12-31 DIAGNOSIS — E1142 Type 2 diabetes mellitus with diabetic polyneuropathy: Secondary | ICD-10-CM | POA: Diagnosis not present

## 2017-12-31 DIAGNOSIS — I10 Essential (primary) hypertension: Secondary | ICD-10-CM | POA: Diagnosis not present

## 2017-12-31 DIAGNOSIS — E039 Hypothyroidism, unspecified: Secondary | ICD-10-CM | POA: Diagnosis not present

## 2018-02-09 DIAGNOSIS — B9689 Other specified bacterial agents as the cause of diseases classified elsewhere: Secondary | ICD-10-CM | POA: Diagnosis not present

## 2018-02-09 DIAGNOSIS — Z6832 Body mass index (BMI) 32.0-32.9, adult: Secondary | ICD-10-CM | POA: Diagnosis not present

## 2018-02-09 DIAGNOSIS — J029 Acute pharyngitis, unspecified: Secondary | ICD-10-CM | POA: Diagnosis not present

## 2018-02-09 DIAGNOSIS — J019 Acute sinusitis, unspecified: Secondary | ICD-10-CM | POA: Diagnosis not present

## 2018-04-04 DIAGNOSIS — R390 Extravasation of urine: Secondary | ICD-10-CM | POA: Diagnosis not present

## 2018-04-04 DIAGNOSIS — N898 Other specified noninflammatory disorders of vagina: Secondary | ICD-10-CM | POA: Diagnosis not present

## 2018-04-04 DIAGNOSIS — J019 Acute sinusitis, unspecified: Secondary | ICD-10-CM | POA: Diagnosis not present

## 2018-04-04 DIAGNOSIS — R109 Unspecified abdominal pain: Secondary | ICD-10-CM | POA: Diagnosis not present

## 2018-04-04 DIAGNOSIS — M545 Low back pain: Secondary | ICD-10-CM | POA: Diagnosis not present

## 2018-04-04 DIAGNOSIS — R197 Diarrhea, unspecified: Secondary | ICD-10-CM | POA: Diagnosis not present

## 2018-04-04 DIAGNOSIS — J029 Acute pharyngitis, unspecified: Secondary | ICD-10-CM | POA: Diagnosis not present

## 2018-04-08 DIAGNOSIS — J019 Acute sinusitis, unspecified: Secondary | ICD-10-CM | POA: Diagnosis not present

## 2018-04-08 DIAGNOSIS — R197 Diarrhea, unspecified: Secondary | ICD-10-CM | POA: Diagnosis not present

## 2018-04-08 DIAGNOSIS — Z6832 Body mass index (BMI) 32.0-32.9, adult: Secondary | ICD-10-CM | POA: Diagnosis not present

## 2018-04-21 DIAGNOSIS — I10 Essential (primary) hypertension: Secondary | ICD-10-CM | POA: Diagnosis not present

## 2018-04-21 DIAGNOSIS — E1165 Type 2 diabetes mellitus with hyperglycemia: Secondary | ICD-10-CM | POA: Diagnosis not present

## 2018-04-21 DIAGNOSIS — E7800 Pure hypercholesterolemia, unspecified: Secondary | ICD-10-CM | POA: Diagnosis not present

## 2018-04-21 DIAGNOSIS — E559 Vitamin D deficiency, unspecified: Secondary | ICD-10-CM | POA: Diagnosis not present

## 2018-04-21 DIAGNOSIS — E039 Hypothyroidism, unspecified: Secondary | ICD-10-CM | POA: Diagnosis not present

## 2018-04-28 DIAGNOSIS — Z01419 Encounter for gynecological examination (general) (routine) without abnormal findings: Secondary | ICD-10-CM | POA: Diagnosis not present

## 2018-04-28 DIAGNOSIS — M170 Bilateral primary osteoarthritis of knee: Secondary | ICD-10-CM | POA: Diagnosis not present

## 2018-04-28 DIAGNOSIS — I10 Essential (primary) hypertension: Secondary | ICD-10-CM | POA: Diagnosis not present

## 2018-04-28 DIAGNOSIS — E559 Vitamin D deficiency, unspecified: Secondary | ICD-10-CM | POA: Diagnosis not present

## 2018-04-28 DIAGNOSIS — E119 Type 2 diabetes mellitus without complications: Secondary | ICD-10-CM | POA: Diagnosis not present

## 2018-04-28 DIAGNOSIS — Z0000 Encounter for general adult medical examination without abnormal findings: Secondary | ICD-10-CM | POA: Diagnosis not present

## 2018-04-28 DIAGNOSIS — E039 Hypothyroidism, unspecified: Secondary | ICD-10-CM | POA: Diagnosis not present

## 2018-04-28 DIAGNOSIS — Z23 Encounter for immunization: Secondary | ICD-10-CM | POA: Diagnosis not present

## 2018-04-29 DIAGNOSIS — E119 Type 2 diabetes mellitus without complications: Secondary | ICD-10-CM | POA: Diagnosis not present

## 2018-04-29 DIAGNOSIS — H2513 Age-related nuclear cataract, bilateral: Secondary | ICD-10-CM | POA: Diagnosis not present

## 2018-05-17 DIAGNOSIS — M1712 Unilateral primary osteoarthritis, left knee: Secondary | ICD-10-CM | POA: Diagnosis not present

## 2018-05-17 DIAGNOSIS — M1711 Unilateral primary osteoarthritis, right knee: Secondary | ICD-10-CM | POA: Diagnosis not present

## 2018-05-20 ENCOUNTER — Other Ambulatory Visit: Payer: Self-pay | Admitting: Orthopedic Surgery

## 2018-06-14 NOTE — Patient Instructions (Addendum)
Christy Baldwin  06/14/2018   Your procedure is scheduled on: 06-24-18    Report to [facility] Main  Entrance    Report to admitting at 12:00 PM    Call this number if you have problems the morning of surgery [phone]    Remember: Do not eat food or drink liquids :After Midnight.  You may have a Clear Liquid Diet from Midnight until 8:30 AM. After 8:30 AM, nothing until after surgery    CLEAR LIQUID DIET   Foods Allowed                                                                     Foods Excluded  Coffee and tea, regular and decaf                             liquids that you cannot  Plain Jell-O in any flavor                                             see through such as: Fruit ices (not with fruit pulp)                                     milk, soups, orange juice  Iced Popsicles                                    All solid food Carbonated beverages, regular and diet                                    Cranberry, grape and apple juices Sports drinks like Gatorade Lightly seasoned clear broth or consume(fat free) Sugar, honey syrup  Sample Menu Breakfast                                Lunch                                     Supper Cranberry juice                    Beef broth                            Chicken broth Jell-O                                     Grape juice  Apple juice Coffee or tea                        Jell-O                                      Popsicle                                                Coffee or tea                        Coffee or tea  _____________________________________________________________________      BRUSH YOUR TEETH MORNING OF SURGERY AND RINSE YOUR MOUTH OUT, NO CHEWING GUM CANDY OR MINTS.     Take these medicines the morning of surgery with A SIP OF WATER: Levothyroxine (Synthroid), and Omeprazole (Prilosec)   DO NOT TAKE ANY DIABETIC MEDICATIONS DAY OF YOUR  SURGERY                               You may not have any metal on your body including hair pins and              piercings  Do not wear jewelry, make-up, lotions, powders or perfumes, deodorant             Do not wear nail polish.  Do not shave  48 hours prior to surgery.                Do not bring valuables to the hospital. [place].  Contacts, dentures or bridgework may not be worn into surgery.  Leave suitcase in the car. After surgery it may be brought to your room.   :  Special Instructions: N/A              Please read over the following fact sheets you were given: _____________________________________________________________________ How to Manage Your Diabetes Before and After Surgery  Why is it important to control my blood sugar before and after surgery? . Improving blood sugar levels before and after surgery helps healing and can limit problems. . A way of improving blood sugar control is eating a healthy diet by: o  Eating less sugar and carbohydrates o  Increasing activity/exercise o  Talking with your doctor about reaching your blood sugar goals . High blood sugars (greater than 180 mg/dL) can raise your risk of infections and slow your recovery, so you will need to focus on controlling your diabetes during the weeks before surgery. . Make sure that the doctor who takes care of your diabetes knows about your planned surgery including the date and location.  How do I manage my blood sugar before surgery? . Check your blood sugar at least 4 times a day, starting 2 days before surgery, to make sure that the level is not too high or low. o Check your blood sugar the morning of your surgery when you wake up and every 2 hours until you get to the Short Stay unit. . If your blood sugar is less than 70 mg/dL,  you will need to treat for low blood sugar: o Do not take insulin. o Treat a low blood sugar (less than 70 mg/dL)  with  cup of clear juice (cranberry or apple), 4 glucose tablets, OR glucose gel. o Recheck blood sugar in 15 minutes after treatment (to make sure it is greater than 70 mg/dL). If your blood sugar is not greater than 70 mg/dL on recheck, call [phone] for further instructions. . Report your blood sugar to the short stay nurse when you get to Short Stay.  . If you are admitted to the hospital after surgery: o Your blood sugar will be checked by the staff and you will probably be given insulin after surgery (instead of oral diabetes medicines) to make sure you have good blood sugar levels. o The goal for blood sugar control after surgery is 80-180 mg/dL.   WHAT DO I DO ABOUT MY DIABETES MEDICATION?  Marland Kitchen Do not take oral diabetes medicines (pills) the morning of surgery.  . THE DAY BEFORE SURGERY, take your usual 65 units of Basaglar Insulin in the morning. And only 37 U of Basaglar Insulin in the evening. Take you usual 30-50 U of Regular Insulin before meals.       . The day of surgery,Take only 32 Units of Basaglar Insulin. Do not take other diabetes injectables, including Byetta (exenatide), Bydureon (exenatide ER), Victoza (liraglutide), or Trulicity (dulaglutide).  . If your CBG is greater than 220 mg/dL, you may take  of your sliding scale  . (correction) dose of insulin.   Patient Signature:  Date:   Nurse Signature:  Date:   Reviewed and Endorsed by [facility] Patient Education Committee, August 2015            [facility] - Preparing for Surgery Before surgery, you can play an important role.  Because skin is not sterile, your skin needs to be as free of germs as possible.  You can reduce the number of germs on your skin by washing with CHG (chlorahexidine gluconate) soap before surgery.  CHG is an antiseptic cleaner which kills germs and bonds with the skin to continue killing germs even after washing. Please DO NOT use if you have an allergy to CHG or antibacterial  soaps.  If your skin becomes reddened/irritated stop using the CHG and inform your nurse when you arrive at Short Stay. Do not shave (including legs and underarms) for at least 48 hours prior to the first CHG shower.  You may shave your face/neck. Please follow these instructions carefully:  1.  Shower with CHG Soap the night before surgery and the  morning of Surgery.  2.  If you choose to wash your hair, wash your hair first as usual with your  normal  shampoo.  3.  After you shampoo, rinse your hair and body thoroughly to remove the  shampoo.                           4.  Use CHG as you would any other liquid soap.  You can apply chg directly  to the skin and wash                       Gently with a scrungie or clean washcloth.  5.  Apply the CHG Soap to your body ONLY FROM THE NECK DOWN.   Do not use on face/ open  Wound or open sores. Avoid contact with eyes, ears mouth and genitals (private parts).                       Wash face,  Genitals (private parts) with your normal soap.             6.  Wash thoroughly, paying special attention to the area where your surgery  will be performed.  7.  Thoroughly rinse your body with warm water from the neck down.  8.  DO NOT shower/wash with your normal soap after using and rinsing off  the CHG Soap.                9.  Pat yourself dry with a clean towel.            10.  Wear clean pajamas.            11.  Place clean sheets on your bed the night of your first shower and do not  sleep with pets. Day of Surgery : Do not apply any lotions/deodorants the morning of surgery.  Please wear clean clothes to the hospital/surgery center.  FAILURE TO FOLLOW THESE INSTRUCTIONS MAY RESULT IN THE CANCELLATION OF YOUR SURGERY PATIENT SIGNATURE_________________________________  NURSE SIGNATURE__________________________________  ________________________________________________________________________   Adam Phenix  An  incentive spirometer is a tool that can help keep your lungs clear and active. This tool measures how well you are filling your lungs with each breath. Taking long deep breaths may help reverse or decrease the chance of developing breathing (pulmonary) problems (especially infection) following:  A long period of time when you are unable to move or be active. BEFORE THE PROCEDURE   If the spirometer includes an indicator to show your best effort, your nurse or respiratory therapist will set it to a desired goal.  If possible, sit up straight or lean slightly forward. Try not to slouch.  Hold the incentive spirometer in an upright position. INSTRUCTIONS FOR USE  1. Sit on the edge of your bed if possible, or sit up as far as you can in bed or on a chair. 2. Hold the incentive spirometer in an upright position. 3. Breathe out normally. 4. Place the mouthpiece in your mouth and seal your lips tightly around it. 5. Breathe in slowly and as deeply as possible, raising the piston or the ball toward the top of the column. 6. Hold your breath for 3-5 seconds or for as long as possible. Allow the piston or ball to fall to the bottom of the column. 7. Remove the mouthpiece from your mouth and breathe out normally. 8. Rest for a few seconds and repeat Steps 1 through 7 at least 10 times every 1-2 hours when you are awake. Take your time and take a few normal breaths between deep breaths. 9. The spirometer may include an indicator to show your best effort. Use the indicator as a goal to work toward during each repetition. 10. After each set of 10 deep breaths, practice coughing to be sure your lungs are clear. If you have an incision (the cut made at the time of surgery), support your incision when coughing by placing a pillow or rolled up towels firmly against it. Once you are able to get out of bed, walk around indoors and cough well. You may stop using the incentive spirometer when instructed by your  caregiver.  RISKS AND COMPLICATIONS  Take your time so you do not  get dizzy or light-headed.  If you are in pain, you may need to take or ask for pain medication before doing incentive spirometry. It is harder to take a deep breath if you are having pain. AFTER USE  Rest and breathe slowly and easily.  It can be helpful to keep track of a log of your progress. Your caregiver can provide you with a simple table to help with this. If you are using the spirometer at home, follow these instructions: [place] IF:   You are having difficultly using the spirometer.  You have trouble using the spirometer as often as instructed.  Your pain medication is not giving enough relief while using the spirometer.  You develop fever of 100.5 F (38.1 C) or higher. SEEK IMMEDIATE MEDICAL CARE IF:   You cough up bloody sputum that had not been present before.  You develop fever of 102 F (38.9 C) or greater.  You develop worsening pain at or near the incision site. MAKE SURE YOU:   Understand these instructions.  Will watch your condition.  Will get help right away if you are not doing well or get worse. Document Released: 10/12/2006 Document Revised: 08/24/2011 Document Reviewed: 12/13/2006 Exit[employer] Patient Information 2014 [employer], [place].   ________________________________________________________________________  WHAT IS A BLOOD TRANSFUSION? Blood Transfusion Information  A transfusion is the replacement of blood or some of its parts. Blood is made up of multiple cells which provide different functions.  Red blood cells carry oxygen and are used for blood loss replacement.  White blood cells fight against infection.  Platelets control bleeding.  Plasma helps clot blood.  Other blood products are available for specialized needs, such as hemophilia or other clotting disorders. BEFORE THE TRANSFUSION  Who gives blood for transfusions?   Healthy volunteers who are fully  evaluated to make sure their blood is safe. This is blood bank blood. Transfusion therapy is the safest it has ever been in the practice of medicine. Before blood is taken from a donor, a complete history is taken to make sure that person has no history of diseases nor engages in risky social behavior (examples are intravenous drug use or sexual activity with multiple partners). The donor's travel history is screened to minimize risk of transmitting infections, such as malaria. The donated blood is tested for signs of infectious diseases, such as HIV and hepatitis. The blood is then tested to be sure it is compatible with you in order to minimize the chance of a transfusion reaction. If you or a relative donates blood, this is often done in anticipation of surgery and is not appropriate for emergency situations. It takes many days to process the donated blood. RISKS AND COMPLICATIONS Although transfusion therapy is very safe and saves many lives, the main dangers of transfusion include:   Getting an infectious disease.  Developing a transfusion reaction. This is an allergic reaction to something in the blood you were given. Every precaution is taken to prevent this. The decision to have a blood transfusion has been considered carefully by your caregiver before blood is given. Blood is not given unless the benefits outweigh the risks. AFTER THE TRANSFUSION  Right after receiving a blood transfusion, you will usually feel much better and more energetic. This is especially true if your red blood cells have gotten low (anemic). The transfusion raises the level of the red blood cells which carry oxygen, and this usually causes an energy increase.  The nurse administering the transfusion will  monitor you carefully for complications. HOME CARE INSTRUCTIONS  No special instructions are needed after a transfusion. You may find your energy is better. Speak with your caregiver about any limitations on activity  for underlying diseases you may have. SEEK MEDICAL CARE IF:   Your condition is not improving after your transfusion.  You develop redness or irritation at the intravenous (IV) site. SEEK IMMEDIATE MEDICAL CARE IF:  Any of the following symptoms occur over the next 12 hours:  Shaking chills.  You have a temperature by mouth above 102 F (38.9 C), not controlled by medicine.  Chest, back, or muscle pain.  People around you feel you are not acting correctly or are confused.  Shortness of breath or difficulty breathing.  Dizziness and fainting.  You get a rash or develop hives.  You have a decrease in urine output.  Your urine turns a dark color or changes to pink, red, or brown. Any of the following symptoms occur over the next 10 days:  You have a temperature by mouth above 102 F (38.9 C), not controlled by medicine.  Shortness of breath.  Weakness after normal activity.  The white part of the eye turns yellow (jaundice).  You have a decrease in the amount of urine or are urinating less often.  Your urine turns a dark color or changes to pink, red, or brown. Document Released: 05/29/2000 Document Revised: 08/24/2011 Document Reviewed: 01/16/2008 [facility] Patient Information 2014 [place], [place].  _______________________________________________________________________

## 2018-06-17 ENCOUNTER — Other Ambulatory Visit: Payer: Self-pay

## 2018-06-17 ENCOUNTER — Encounter (HOSPITAL_COMMUNITY): Payer: Self-pay

## 2018-06-17 ENCOUNTER — Ambulatory Visit (HOSPITAL_COMMUNITY)
Admission: RE | Admit: 2018-06-17 | Discharge: 2018-06-17 | Disposition: A | Discharge status: Home / Self Care | Payer: Medicare Other | Source: Ambulatory Visit | Attending: Orthopedic Surgery | Admitting: Orthopedic Surgery

## 2018-06-17 ENCOUNTER — Encounter (HOSPITAL_COMMUNITY)
Admission: RE | Admit: 2018-06-17 | Discharge: 2018-06-17 | Disposition: A | Discharge status: Home / Self Care | Payer: Medicare Other | Source: Ambulatory Visit | Attending: Orthopedic Surgery | Admitting: Orthopedic Surgery

## 2018-06-17 ENCOUNTER — Other Ambulatory Visit: Payer: Self-pay | Admitting: Orthopedic Surgery

## 2018-06-17 DIAGNOSIS — Z01818 Encounter for other preprocedural examination: Secondary | ICD-10-CM | POA: Diagnosis not present

## 2018-06-17 DIAGNOSIS — Z01811 Encounter for preprocedural respiratory examination: Secondary | ICD-10-CM

## 2018-06-17 HISTORY — DX: Depression, unspecified: F32.A

## 2018-06-17 HISTORY — DX: Essential (primary) hypertension: I10

## 2018-06-17 HISTORY — DX: Hypothyroidism, unspecified: E03.9

## 2018-06-17 HISTORY — DX: Major depressive disorder, single episode, unspecified: F32.9

## 2018-06-17 HISTORY — DX: Headache, unspecified: R51.9

## 2018-06-17 HISTORY — DX: Headache: R51

## 2018-06-17 HISTORY — DX: Unspecified osteoarthritis, unspecified site: M19.90

## 2018-06-17 HISTORY — DX: Type 2 diabetes mellitus without complications: E11.9

## 2018-06-17 LAB — COMPREHENSIVE METABOLIC PANEL
ALBUMIN: 3.8 g/dL (ref 3.5–5.0)
ALT: 33 U/L (ref 0–44)
AST: 40 U/L (ref 15–41)
Alkaline Phosphatase: 41 U/L (ref 38–126)
Anion gap: 10 (ref 5–15)
BUN: 9 mg/dL (ref 8–23)
CO2: 28 mmol/L (ref 22–32)
Calcium: 9.1 mg/dL (ref 8.9–10.3)
Chloride: 105 mmol/L (ref 98–111)
Creatinine, Ser: 0.8 mg/dL (ref 0.44–1.00)
GFR calc Af Amer: >60 mL/min (ref >60)
GFR calc non Af Amer: >60 mL/min (ref >60)
GLUCOSE: 134 mg/dL — AB (ref 70–99)
POTASSIUM: 3.8 mmol/L (ref 3.5–5.1)
SODIUM: 143 mmol/L (ref 135–145)
Total Bilirubin: 1.4 mg/dL — ABNORMAL HIGH (ref 0.3–1.2)
Total Protein: 6.3 g/dL — ABNORMAL LOW (ref 6.5–8.1)

## 2018-06-17 LAB — CBC WITH DIFFERENTIAL/PLATELET
Abs Immature Granulocytes: 0.01 10*3/uL (ref 0.00–0.07)
BASOS PCT: 1 %
Basophils Absolute: 0 10*3/uL (ref 0.0–0.1)
Eosinophils Absolute: 0 10*3/uL (ref 0.0–0.5)
Eosinophils Relative: 1 %
HCT: 35.5 % — ABNORMAL LOW (ref 36.0–46.0)
Hemoglobin: 11.5 g/dL — ABNORMAL LOW (ref 12.0–15.0)
Immature Granulocytes: 0 %
Lymphocytes Relative: 29 %
Lymphs Abs: 1 10*3/uL (ref 0.7–4.0)
MCH: 31.9 pg (ref 26.0–34.0)
MCHC: 32.4 g/dL (ref 30.0–36.0)
MCV: 98.6 fL (ref 80.0–100.0)
Monocytes Absolute: 0.3 10*3/uL (ref 0.1–1.0)
Monocytes Relative: 9 %
NRBC: 0 % (ref 0.0–0.2)
Neutro Abs: 2.2 10*3/uL (ref 1.7–7.7)
Neutrophils Relative %: 60 %
Platelets: 84 10*3/uL — ABNORMAL LOW (ref 150–400)
RBC: 3.6 MIL/uL — AB (ref 3.87–5.11)
RDW: 14.5 % (ref 11.5–15.5)
WBC: 3.6 10*3/uL — ABNORMAL LOW (ref 4.0–10.5)

## 2018-06-17 LAB — URINALYSIS, ROUTINE W REFLEX MICROSCOPIC
Bilirubin Urine: NEGATIVE
Glucose, UA: NEGATIVE mg/dL
Hgb urine dipstick: NEGATIVE
Ketones, ur: NEGATIVE mg/dL
Nitrite: NEGATIVE
Protein, ur: NEGATIVE mg/dL
Specific Gravity, Urine: 1.014 (ref 1.005–1.030)
pH: 6 (ref 5.0–8.0)

## 2018-06-17 LAB — PROTIME-INR
INR: 1
Prothrombin Time: 13.1 seconds (ref 11.4–15.2)

## 2018-06-17 LAB — GLUCOSE, CAPILLARY
Glucose-Capillary: 46 mg/dL — ABNORMAL LOW (ref 70–99)
Glucose-Capillary: 92 mg/dL (ref 70–99)

## 2018-06-17 LAB — APTT: APTT: 29 s (ref 24–36)

## 2018-06-17 LAB — HEMOGLOBIN A1C
Hgb A1c MFr Bld: 6.8 % — ABNORMAL HIGH (ref 4.8–5.6)
MEAN PLASMA GLUCOSE: 148.46 mg/dL

## 2018-06-17 LAB — SURGICAL PCR SCREEN
MRSA, PCR: NEGATIVE
Staphylococcus aureus: NEGATIVE

## 2018-06-17 LAB — ABO/RH: ABO/RH(D): A POS

## 2018-06-17 NOTE — Care Plan (Signed)
Spoke with patient prior to surgery. She will discharge to her sister's home in [place] "for as long as needed"  [place]  [place], [place] referral to [facility] and rolling walker ordered.   Christy Baldwin, [place]

## 2018-06-20 NOTE — Progress Notes (Signed)
SPOKE WITH PATIENT BY PHONE AND PATIENT STATED SURGERY TIME IS 945 AM , CAN SHE HAVE CLEAR LIQUIDS AFTER MIDNIGHT, PATIENT INSTRUCTED NPO AFTER MIDNIGHT DUE TO SURGERY TIME 945AM

## 2018-06-20 NOTE — Progress Notes (Addendum)
Called patient and she is aware she may eat her regular diet until 45midnite the nite before then npo after midnite.  Since patient is a diabetic she is aware to eat a good healthy snack prior to bedtime with protein. Patient aware of time change on her surgery and will arrive in Admitting at 0715am with surgery start time of 0945am. Patient reports that glucose levels have been doing " fine" and she voices no complaints.

## 2018-06-23 MED ORDER — BUPIVACAINE LIPOSOME 1.3 % IJ SUSP
20.0000 mL | Freq: Once | INTRAMUSCULAR | Status: DC
  Filled 2018-06-23: qty 20

## 2018-06-23 NOTE — Anesthesia Preprocedure Evaluation (Addendum)
Anesthesia Evaluation  Patient identified by MRN, date of birth, ID band Patient awake    Reviewed: Allergy & Precautions, NPO status , Patient's Chart, lab work & pertinent test results  Airway Mallampati: II  TM Distance: >3 FB Neck ROM: Full    Dental no notable dental hx. (+) Teeth Intact, Dental Advisory Given   Pulmonary neg pulmonary ROS,    Pulmonary exam normal breath sounds clear to auscultation       Cardiovascular hypertension, Pt. on medications Normal cardiovascular exam Rhythm:Regular Rate:Normal  EKG 06/17/2018 SR w RBBB   Neuro/Psych  Headaches, PSYCHIATRIC DISORDERS Depression    GI/Hepatic GERD  Medicated and Controlled,  Endo/Other  diabetes, Well Controlled, Type 1Hypothyroidism   Renal/GU      Musculoskeletal  (+) Arthritis ,   Abdominal (+) + obese,   Peds  Hematology  (+) Blood dyscrasia, anemia , Hgb (11.6) Plts 84k   Anesthesia Other Findings   Reproductive/Obstetrics                           Lab Results  Component Value Date   WBC 3.6 (L) 06/17/2018   HGB 11.5 (L) 06/17/2018   HCT 35.5 (L) 06/17/2018   MCV 98.6 06/17/2018   PLT 84 (L) 06/17/2018    Anesthesia Physical Anesthesia Plan  ASA: III  Anesthesia Plan: General   Post-op Pain Management:  Regional for Post-op pain   Induction: Intravenous  PONV Risk Score and Plan: 3 and Treatment may vary due to age or medical condition, Ondansetron and Dexamethasone  Airway Management Planned: Oral ETT  Additional Equipment:   Intra-op Plan:   Post-operative Plan: Extubation in OR  Informed Consent: I have reviewed the patients History and Physical, chart, labs and discussed the procedure including the risks, benefits and alternatives for the proposed anesthesia with the patient or authorized representative who has indicated his/her understanding and acceptance.   Dental advisory given  Plan  Discussed with:   Anesthesia Plan Comments: (L Adductor canal block, +/- clonidine)       Lab Results  Component Value Date   WBC 3.6 (L) 06/17/2018   HGB 11.5 (L) 06/17/2018   HCT 35.5 (L) 06/17/2018   MCV 98.6 06/17/2018   PLT 84 (L) 06/17/2018   Lab Results  Component Value Date   CREATININE 0.80 06/17/2018   BUN 9 06/17/2018   NA 143 06/17/2018   K 3.8 06/17/2018   CL 105 06/17/2018   CO2 28 06/17/2018      Anesthesia Quick Evaluation

## 2018-06-24 ENCOUNTER — Ambulatory Visit (HOSPITAL_COMMUNITY): Payer: Medicare Other | Admitting: Physician Assistant

## 2018-06-24 ENCOUNTER — Encounter (HOSPITAL_COMMUNITY)
Admission: RE | Disposition: A | Discharge status: Home Health | Payer: Self-pay | Source: Home / Self Care | Attending: Orthopedic Surgery

## 2018-06-24 ENCOUNTER — Encounter (HOSPITAL_COMMUNITY): Payer: Self-pay | Admitting: *Deleted

## 2018-06-24 ENCOUNTER — Inpatient Hospital Stay (HOSPITAL_COMMUNITY)
Admission: RE | Admit: 2018-06-24 | Discharge: 2018-06-27 | DRG: 470 | Disposition: A | Discharge status: Home Health | Payer: Medicare Other | Attending: Orthopedic Surgery | Admitting: Orthopedic Surgery

## 2018-06-24 ENCOUNTER — Ambulatory Visit (HOSPITAL_COMMUNITY): Payer: Medicare Other | Admitting: Certified Registered"

## 2018-06-24 ENCOUNTER — Other Ambulatory Visit: Payer: Self-pay

## 2018-06-24 DIAGNOSIS — E119 Type 2 diabetes mellitus without complications: Secondary | ICD-10-CM

## 2018-06-24 DIAGNOSIS — I959 Hypotension, unspecified: Secondary | ICD-10-CM | POA: Diagnosis not present

## 2018-06-24 DIAGNOSIS — E039 Hypothyroidism, unspecified: Secondary | ICD-10-CM | POA: Diagnosis present

## 2018-06-24 DIAGNOSIS — Z7982 Long term (current) use of aspirin: Secondary | ICD-10-CM | POA: Diagnosis not present

## 2018-06-24 DIAGNOSIS — D62 Acute posthemorrhagic anemia: Secondary | ICD-10-CM

## 2018-06-24 DIAGNOSIS — Z79899 Other long term (current) drug therapy: Secondary | ICD-10-CM

## 2018-06-24 DIAGNOSIS — M1712 Unilateral primary osteoarthritis, left knee: Secondary | ICD-10-CM | POA: Diagnosis not present

## 2018-06-24 DIAGNOSIS — K219 Gastro-esophageal reflux disease without esophagitis: Secondary | ICD-10-CM | POA: Diagnosis not present

## 2018-06-24 DIAGNOSIS — I10 Essential (primary) hypertension: Secondary | ICD-10-CM | POA: Diagnosis not present

## 2018-06-24 DIAGNOSIS — K5730 Diverticulosis of large intestine without perforation or abscess without bleeding: Secondary | ICD-10-CM | POA: Diagnosis not present

## 2018-06-24 DIAGNOSIS — Z9851 Tubal ligation status: Secondary | ICD-10-CM

## 2018-06-24 DIAGNOSIS — G2581 Restless legs syndrome: Secondary | ICD-10-CM | POA: Diagnosis present

## 2018-06-24 DIAGNOSIS — G8918 Other acute postprocedural pain: Secondary | ICD-10-CM | POA: Diagnosis not present

## 2018-06-24 DIAGNOSIS — E875 Hyperkalemia: Secondary | ICD-10-CM | POA: Diagnosis not present

## 2018-06-24 DIAGNOSIS — Z79890 Hormone replacement therapy (postmenopausal): Secondary | ICD-10-CM

## 2018-06-24 DIAGNOSIS — E1142 Type 2 diabetes mellitus with diabetic polyneuropathy: Secondary | ICD-10-CM | POA: Diagnosis present

## 2018-06-24 DIAGNOSIS — Z794 Long term (current) use of insulin: Secondary | ICD-10-CM | POA: Diagnosis not present

## 2018-06-24 DIAGNOSIS — Z9049 Acquired absence of other specified parts of digestive tract: Secondary | ICD-10-CM

## 2018-06-24 DIAGNOSIS — Z90710 Acquired absence of both cervix and uterus: Secondary | ICD-10-CM | POA: Diagnosis not present

## 2018-06-24 DIAGNOSIS — F329 Major depressive disorder, single episode, unspecified: Secondary | ICD-10-CM | POA: Diagnosis not present

## 2018-06-24 DIAGNOSIS — Z6831 Body mass index (BMI) 31.0-31.9, adult: Secondary | ICD-10-CM

## 2018-06-24 DIAGNOSIS — E669 Obesity, unspecified: Secondary | ICD-10-CM | POA: Diagnosis not present

## 2018-06-24 HISTORY — PX: TOTAL KNEE ARTHROPLASTY: SHX125

## 2018-06-24 LAB — GLUCOSE, CAPILLARY
GLUCOSE-CAPILLARY: 249 mg/dL — AB (ref 70–99)
Glucose-Capillary: 157 mg/dL — ABNORMAL HIGH (ref 70–99)
Glucose-Capillary: 136 mg/dL — ABNORMAL HIGH (ref 70–99)
Glucose-Capillary: 244 mg/dL — ABNORMAL HIGH (ref 70–99)

## 2018-06-24 LAB — TYPE AND SCREEN
ABO/RH(D): A POS
Antibody Screen: NEGATIVE

## 2018-06-24 SURGERY — ARTHROPLASTY, KNEE, TOTAL
Anesthesia: General | Site: Knee | Laterality: Left

## 2018-06-24 MED ORDER — DOCUSATE SODIUM 100 MG PO CAPS: 100.0000 mg | ORAL_CAPSULE | Freq: Two times a day (BID) | ORAL | 0 refills | Status: AC

## 2018-06-24 MED ORDER — DIPHENHYDRAMINE HCL 12.5 MG/5ML PO ELIX: 12.5000 mg | ORAL_SOLUTION | ORAL | Status: DC | PRN

## 2018-06-24 MED ORDER — POLYETHYLENE GLYCOL 3350 17 G PO PACK: 17.0000 g | PACK | Freq: Every day | ORAL | Status: DC | PRN

## 2018-06-24 MED ORDER — SODIUM CHLORIDE (PF) 0.9 % IJ SOLN
INTRAMUSCULAR | Status: AC
  Filled 2018-06-24: qty 50

## 2018-06-24 MED ORDER — ACETAMINOPHEN 10 MG/ML IV SOLN: 1000.0000 mg | Freq: Once | INTRAVENOUS | Status: DC | PRN

## 2018-06-24 MED ORDER — OMEPRAZOLE 20 MG PO CPDR
20.0000 mg | DELAYED_RELEASE_CAPSULE | Freq: Every day | ORAL | Status: DC
  Administered 2018-06-24 – 2018-06-27 (×4): 20 mg via ORAL
  Filled 2018-06-24 (×4): qty 1

## 2018-06-24 MED ORDER — ALUM & MAG HYDROXIDE-SIMETH 200-200-20 MG/5ML PO SUSP: 30.0000 mL | ORAL | Status: DC | PRN

## 2018-06-24 MED ORDER — ROCURONIUM BROMIDE 10 MG/ML (PF) SYRINGE
PREFILLED_SYRINGE | INTRAVENOUS | Status: AC
  Filled 2018-06-24: qty 10

## 2018-06-24 MED ORDER — PROPOFOL 10 MG/ML IV BOLUS
INTRAVENOUS | Status: DC | PRN
  Administered 2018-06-24: 130 mg via INTRAVENOUS

## 2018-06-24 MED ORDER — HYDROMORPHONE HCL 1 MG/ML IJ SOLN: 0.5000 mg | INTRAMUSCULAR | Status: DC | PRN

## 2018-06-24 MED ORDER — TRANEXAMIC ACID-NACL 1000-0.7 MG/100ML-% IV SOLN
1000.0000 mg | INTRAVENOUS | Status: AC
  Administered 2018-06-24: 1000 mg via INTRAVENOUS
  Filled 2018-06-24: qty 100

## 2018-06-24 MED ORDER — CHLORHEXIDINE GLUCONATE 4 % EX LIQD: 60.0000 mL | Freq: Once | CUTANEOUS | Status: DC

## 2018-06-24 MED ORDER — ONDANSETRON HCL 4 MG/2ML IJ SOLN
INTRAMUSCULAR | Status: AC
  Filled 2018-06-24: qty 2

## 2018-06-24 MED ORDER — INSULIN ASPART 100 UNIT/ML ~~LOC~~ SOLN
20.0000 [IU] | Freq: Three times a day (TID) | SUBCUTANEOUS | Status: DC
  Administered 2018-06-24 – 2018-06-27 (×8): 20 [IU] via SUBCUTANEOUS

## 2018-06-24 MED ORDER — DEXAMETHASONE SODIUM PHOSPHATE 10 MG/ML IJ SOLN
INTRAMUSCULAR | Status: DC | PRN
  Administered 2018-06-24: 4 mg via INTRAVENOUS

## 2018-06-24 MED ORDER — FENTANYL CITRATE (PF) 100 MCG/2ML IJ SOLN
50.0000 ug | INTRAMUSCULAR | Status: DC
  Administered 2018-06-24: 50 ug via INTRAVENOUS
  Filled 2018-06-24: qty 2

## 2018-06-24 MED ORDER — METHOCARBAMOL 500 MG IVPB - SIMPLE MED
INTRAVENOUS | Status: AC
  Administered 2018-06-24: 500 mg via INTRAVENOUS
  Filled 2018-06-24: qty 50

## 2018-06-24 MED ORDER — PHENYLEPHRINE 40 MCG/ML (10ML) SYRINGE FOR IV PUSH (FOR BLOOD PRESSURE SUPPORT)
PREFILLED_SYRINGE | INTRAVENOUS | Status: DC | PRN
  Administered 2018-06-24: 120 ug via INTRAVENOUS
  Administered 2018-06-24: 80 ug via INTRAVENOUS
  Administered 2018-06-24: 120 ug via INTRAVENOUS
  Administered 2018-06-24: 80 ug via INTRAVENOUS

## 2018-06-24 MED ORDER — SODIUM CHLORIDE 0.9% FLUSH
INTRAVENOUS | Status: DC | PRN
  Administered 2018-06-24: 50 mL

## 2018-06-24 MED ORDER — BUPIVACAINE-EPINEPHRINE 0.5% -1:200000 IJ SOLN
INTRAMUSCULAR | Status: DC | PRN
  Administered 2018-06-24: 30 mL

## 2018-06-24 MED ORDER — PAROXETINE HCL 20 MG PO TABS
20.0000 mg | ORAL_TABLET | Freq: Every day | ORAL | Status: DC
  Administered 2018-06-24 – 2018-06-26 (×3): 20 mg via ORAL
  Filled 2018-06-24 (×3): qty 1

## 2018-06-24 MED ORDER — GABAPENTIN 300 MG PO CAPS
300.0000 mg | ORAL_CAPSULE | Freq: Every day | ORAL | Status: DC
  Administered 2018-06-24 – 2018-06-26 (×3): 300 mg via ORAL
  Filled 2018-06-24 (×3): qty 1

## 2018-06-24 MED ORDER — CEFAZOLIN SODIUM-DEXTROSE 2-4 GM/100ML-% IV SOLN
2.0000 g | INTRAVENOUS | Status: AC
  Administered 2018-06-24: 2 g via INTRAVENOUS
  Filled 2018-06-24: qty 100

## 2018-06-24 MED ORDER — SUGAMMADEX SODIUM 200 MG/2ML IV SOLN
INTRAVENOUS | Status: DC | PRN
  Administered 2018-06-24: 200 mg via INTRAVENOUS

## 2018-06-24 MED ORDER — ONDANSETRON HCL 4 MG/2ML IJ SOLN
INTRAMUSCULAR | Status: DC | PRN
  Administered 2018-06-24: 4 mg via INTRAVENOUS

## 2018-06-24 MED ORDER — GABAPENTIN 300 MG PO CAPS
300.0000 mg | ORAL_CAPSULE | Freq: Two times a day (BID) | ORAL | Status: DC
  Administered 2018-06-24 – 2018-06-27 (×6): 300 mg via ORAL
  Filled 2018-06-24 (×6): qty 1

## 2018-06-24 MED ORDER — DOCUSATE SODIUM 100 MG PO CAPS
100.0000 mg | ORAL_CAPSULE | Freq: Two times a day (BID) | ORAL | Status: DC
  Administered 2018-06-24 – 2018-06-26 (×5): 100 mg via ORAL
  Filled 2018-06-24 (×6): qty 1

## 2018-06-24 MED ORDER — PROPOFOL 10 MG/ML IV BOLUS
INTRAVENOUS | Status: AC
  Filled 2018-06-24: qty 40

## 2018-06-24 MED ORDER — ACETAMINOPHEN 325 MG PO TABS
325.0000 mg | ORAL_TABLET | Freq: Four times a day (QID) | ORAL | Status: DC | PRN
  Administered 2018-06-25: 650 mg via ORAL
  Filled 2018-06-24: qty 2

## 2018-06-24 MED ORDER — FENTANYL CITRATE (PF) 100 MCG/2ML IJ SOLN
INTRAMUSCULAR | Status: AC
  Filled 2018-06-24: qty 2

## 2018-06-24 MED ORDER — TRANEXAMIC ACID-NACL 1000-0.7 MG/100ML-% IV SOLN
1000.0000 mg | Freq: Once | INTRAVENOUS | Status: AC
  Administered 2018-06-24: 1000 mg via INTRAVENOUS
  Filled 2018-06-24: qty 100

## 2018-06-24 MED ORDER — BUPIVACAINE LIPOSOME 1.3 % IJ SUSP
INTRAMUSCULAR | Status: DC | PRN
  Administered 2018-06-24: 20 mL

## 2018-06-24 MED ORDER — LEVOTHYROXINE SODIUM 75 MCG PO TABS
150.0000 ug | ORAL_TABLET | Freq: Every day | ORAL | Status: DC
  Administered 2018-06-25 – 2018-06-27 (×3): 150 ug via ORAL
  Filled 2018-06-24 (×3): qty 2

## 2018-06-24 MED ORDER — MAGNESIUM CITRATE PO SOLN: 1.0000 | Freq: Once | ORAL | Status: DC | PRN

## 2018-06-24 MED ORDER — OXYCODONE HCL 5 MG PO TABS
5.0000 mg | ORAL_TABLET | ORAL | Status: DC | PRN
  Administered 2018-06-24 (×2): 5 mg via ORAL
  Administered 2018-06-24 – 2018-06-25 (×2): 10 mg via ORAL
  Administered 2018-06-25 (×4): 5 mg via ORAL
  Administered 2018-06-26 (×2): 10 mg via ORAL
  Administered 2018-06-26 (×2): 5 mg via ORAL
  Administered 2018-06-27: 10 mg via ORAL
  Filled 2018-06-24 (×4): qty 1
  Filled 2018-06-24: qty 2
  Filled 2018-06-24: qty 1
  Filled 2018-06-24 (×3): qty 2
  Filled 2018-06-24 (×2): qty 1
  Filled 2018-06-24 (×2): qty 2

## 2018-06-24 MED ORDER — METHOCARBAMOL 500 MG PO TABS
500.0000 mg | ORAL_TABLET | Freq: Four times a day (QID) | ORAL | Status: DC | PRN
  Administered 2018-06-24 – 2018-06-26 (×5): 500 mg via ORAL
  Filled 2018-06-24 (×5): qty 1

## 2018-06-24 MED ORDER — OXYCODONE-ACETAMINOPHEN 5-325 MG PO TABS: 1.0000 | ORAL_TABLET | Freq: Four times a day (QID) | ORAL | 0 refills | Status: AC | PRN

## 2018-06-24 MED ORDER — ASPIRIN EC 325 MG PO TBEC: 325.0000 mg | DELAYED_RELEASE_TABLET | Freq: Two times a day (BID) | ORAL | 0 refills | Status: AC

## 2018-06-24 MED ORDER — PHENYLEPHRINE 40 MCG/ML (10ML) SYRINGE FOR IV PUSH (FOR BLOOD PRESSURE SUPPORT)
PREFILLED_SYRINGE | INTRAVENOUS | Status: AC
  Filled 2018-06-24: qty 10

## 2018-06-24 MED ORDER — ONDANSETRON HCL 4 MG PO TABS: 4.0000 mg | ORAL_TABLET | Freq: Four times a day (QID) | ORAL | Status: DC | PRN

## 2018-06-24 MED ORDER — SODIUM CHLORIDE 0.9 % IR SOLN
Status: DC | PRN
  Administered 2018-06-24 (×2): 1000 mL

## 2018-06-24 MED ORDER — DEXAMETHASONE SODIUM PHOSPHATE 10 MG/ML IJ SOLN
INTRAMUSCULAR | Status: AC
  Filled 2018-06-24: qty 1

## 2018-06-24 MED ORDER — WATER FOR IRRIGATION, STERILE IR SOLN
Status: DC | PRN
  Administered 2018-06-24: 2000 mL

## 2018-06-24 MED ORDER — LIDOCAINE 2% (20 MG/ML) 5 ML SYRINGE
INTRAMUSCULAR | Status: AC
  Filled 2018-06-24: qty 5

## 2018-06-24 MED ORDER — LIDOCAINE 2% (20 MG/ML) 5 ML SYRINGE
INTRAMUSCULAR | Status: DC | PRN
  Administered 2018-06-24: 60 mg via INTRAVENOUS

## 2018-06-24 MED ORDER — ROCURONIUM BROMIDE 10 MG/ML (PF) SYRINGE
PREFILLED_SYRINGE | INTRAVENOUS | Status: DC | PRN
  Administered 2018-06-24 (×2): 10 mg via INTRAVENOUS
  Administered 2018-06-24: 50 mg via INTRAVENOUS

## 2018-06-24 MED ORDER — LACTATED RINGERS IV SOLN
INTRAVENOUS | Status: DC
  Administered 2018-06-24: 08:00:00 via INTRAVENOUS

## 2018-06-24 MED ORDER — CLONIDINE HCL 0.1 MG PO TABS
0.3000 mg | ORAL_TABLET | Freq: Every day | ORAL | Status: DC
  Administered 2018-06-24 – 2018-06-26 (×3): 0.3 mg via ORAL
  Filled 2018-06-24 (×3): qty 3

## 2018-06-24 MED ORDER — MIDAZOLAM HCL 2 MG/2ML IJ SOLN
1.0000 mg | INTRAMUSCULAR | Status: DC
  Administered 2018-06-24: 1 mg via INTRAVENOUS
  Filled 2018-06-24: qty 2

## 2018-06-24 MED ORDER — SODIUM CHLORIDE 0.9 % IV SOLN
INTRAVENOUS | Status: DC
  Administered 2018-06-24 – 2018-06-25 (×3): via INTRAVENOUS

## 2018-06-24 MED ORDER — CEFAZOLIN SODIUM-DEXTROSE 2-4 GM/100ML-% IV SOLN
2.0000 g | Freq: Four times a day (QID) | INTRAVENOUS | Status: AC
  Administered 2018-06-24 (×2): 2 g via INTRAVENOUS
  Filled 2018-06-24 (×2): qty 100

## 2018-06-24 MED ORDER — INSULIN ASPART 100 UNIT/ML ~~LOC~~ SOLN
0.0000 [IU] | Freq: Three times a day (TID) | SUBCUTANEOUS | Status: DC
  Administered 2018-06-24: 5 [IU] via SUBCUTANEOUS
  Administered 2018-06-25: 8 [IU] via SUBCUTANEOUS
  Administered 2018-06-25 (×2): 11 [IU] via SUBCUTANEOUS
  Administered 2018-06-26: 5 [IU] via SUBCUTANEOUS
  Administered 2018-06-26: 11 [IU] via SUBCUTANEOUS
  Administered 2018-06-26: 5 [IU] via SUBCUTANEOUS
  Administered 2018-06-27: 2 [IU] via SUBCUTANEOUS

## 2018-06-24 MED ORDER — ONDANSETRON HCL 4 MG/2ML IJ SOLN: 4.0000 mg | Freq: Four times a day (QID) | INTRAMUSCULAR | Status: DC | PRN

## 2018-06-24 MED ORDER — TIZANIDINE HCL 2 MG PO TABS: 2.0000 mg | ORAL_TABLET | Freq: Three times a day (TID) | ORAL | 0 refills | Status: AC | PRN

## 2018-06-24 MED ORDER — FENTANYL CITRATE (PF) 250 MCG/5ML IJ SOLN
INTRAMUSCULAR | Status: DC | PRN
  Administered 2018-06-24 (×2): 25 ug via INTRAVENOUS
  Administered 2018-06-24: 50 ug via INTRAVENOUS

## 2018-06-24 MED ORDER — SUGAMMADEX SODIUM 200 MG/2ML IV SOLN
INTRAVENOUS | Status: AC
  Filled 2018-06-24: qty 2

## 2018-06-24 MED ORDER — BISACODYL 5 MG PO TBEC: 5.0000 mg | DELAYED_RELEASE_TABLET | Freq: Every day | ORAL | Status: DC | PRN

## 2018-06-24 MED ORDER — ONDANSETRON HCL 4 MG/2ML IJ SOLN: 4.0000 mg | Freq: Once | INTRAMUSCULAR | Status: DC | PRN

## 2018-06-24 MED ORDER — FENTANYL CITRATE (PF) 100 MCG/2ML IJ SOLN: 25.0000 ug | INTRAMUSCULAR | Status: DC | PRN

## 2018-06-24 MED ORDER — DEXAMETHASONE SODIUM PHOSPHATE 10 MG/ML IJ SOLN
10.0000 mg | Freq: Two times a day (BID) | INTRAMUSCULAR | Status: AC
  Administered 2018-06-24 – 2018-06-25 (×3): 10 mg via INTRAVENOUS
  Filled 2018-06-24 (×3): qty 1

## 2018-06-24 MED ORDER — ROPIVACAINE HCL 5 MG/ML IJ SOLN
INTRAMUSCULAR | Status: DC | PRN
  Administered 2018-06-24: 30 mL via PERINEURAL

## 2018-06-24 MED ORDER — METHOCARBAMOL 500 MG IVPB - SIMPLE MED
500.0000 mg | Freq: Four times a day (QID) | INTRAVENOUS | Status: DC | PRN
  Administered 2018-06-24: 500 mg via INTRAVENOUS
  Filled 2018-06-24: qty 50

## 2018-06-24 MED ORDER — CELECOXIB 200 MG PO CAPS
200.0000 mg | ORAL_CAPSULE | Freq: Two times a day (BID) | ORAL | Status: DC
  Administered 2018-06-24 – 2018-06-27 (×6): 200 mg via ORAL
  Filled 2018-06-24 (×6): qty 1

## 2018-06-24 MED ORDER — MIDAZOLAM HCL 2 MG/2ML IJ SOLN
INTRAMUSCULAR | Status: AC
  Filled 2018-06-24: qty 2

## 2018-06-24 MED ORDER — BUPIVACAINE-EPINEPHRINE (PF) 0.25% -1:200000 IJ SOLN
INTRAMUSCULAR | Status: AC
  Filled 2018-06-24: qty 30

## 2018-06-24 MED ORDER — INSULIN GLARGINE 100 UNIT/ML ~~LOC~~ SOLN
55.0000 [IU] | Freq: Every day | SUBCUTANEOUS | Status: DC
  Administered 2018-06-24 – 2018-06-26 (×3): 55 [IU] via SUBCUTANEOUS
  Filled 2018-06-24 (×4): qty 0.55

## 2018-06-24 MED ORDER — BASAGLAR KWIKPEN 100 UNIT/ML ~~LOC~~ SOPN: 40.0000 [IU] | PEN_INJECTOR | SUBCUTANEOUS | Status: DC

## 2018-06-24 MED ORDER — ASPIRIN EC 325 MG PO TBEC
325.0000 mg | DELAYED_RELEASE_TABLET | Freq: Two times a day (BID) | ORAL | Status: DC
  Administered 2018-06-25 – 2018-06-27 (×5): 325 mg via ORAL
  Filled 2018-06-24 (×5): qty 1

## 2018-06-24 MED ORDER — INSULIN GLARGINE 100 UNIT/ML ~~LOC~~ SOLN
40.0000 [IU] | Freq: Every morning | SUBCUTANEOUS | Status: DC
  Administered 2018-06-25 – 2018-06-27 (×3): 40 [IU] via SUBCUTANEOUS
  Filled 2018-06-24 (×3): qty 0.4

## 2018-06-24 SURGICAL SUPPLY — 53 items
ATTUNE MED DOME PAT 41 KNEE (Knees) ×1 IMPLANT
ATTUNE PS FEM LT SZ 6 CEM KNEE (Femur) ×1 IMPLANT
ATTUNE PSRP INSR SZ6 5 KNEE (Insert) ×1 IMPLANT
BAG ZIPLOCK 12X15 (MISCELLANEOUS) ×2 IMPLANT
BANDAGE ACE 6X5 VEL STRL LF (GAUZE/BANDAGES/DRESSINGS) ×2 IMPLANT
BASE TIBIAL ROT PLAT SZ 7 KNEE (Knees) IMPLANT
BENZOIN TINCTURE PRP APPL 2/3 (GAUZE/BANDAGES/DRESSINGS) ×2 IMPLANT
BLADE SAGITTAL 25.0X1.19X90 (BLADE) ×2 IMPLANT
BLADE SAW SGTL 11.0X1.19X90.0M (BLADE) ×2 IMPLANT
BLADE SURG SZ10 CARB STEEL (BLADE) ×4 IMPLANT
BOOTIES KNEE HIGH SLOAN (MISCELLANEOUS) ×2 IMPLANT
BOWL SMART MIX CTS (DISPOSABLE) ×2 IMPLANT
CEMENT HV SMART SET (Cement) ×4 IMPLANT
COVER WAND RF STERILE (DRAPES) ×1 IMPLANT
CUFF TOURN SGL QUICK 34 (TOURNIQUET CUFF) ×1
CUFF TRNQT CYL 34X4X40X1 (TOURNIQUET CUFF) ×1 IMPLANT
DECANTER SPIKE VIAL GLASS SM (MISCELLANEOUS) ×1 IMPLANT
DRAPE U-SHAPE 47X51 STRL (DRAPES) ×2 IMPLANT
DRSG AQUACEL AG ADV 3.5X10 (GAUZE/BANDAGES/DRESSINGS) ×2 IMPLANT
DURAPREP 26ML APPLICATOR (WOUND CARE) ×2 IMPLANT
ELECT REM PT RETURN 15FT ADLT (MISCELLANEOUS) ×2 IMPLANT
GLOVE BIOGEL PI IND STRL 8 (GLOVE) ×2 IMPLANT
GLOVE BIOGEL PI INDICATOR 8 (GLOVE) ×2
GLOVE ECLIPSE 7.5 STRL STRAW (GLOVE) ×4 IMPLANT
GOWN STRL REUS W/TWL XL LVL3 (GOWN DISPOSABLE) ×4 IMPLANT
HANDPIECE INTERPULSE COAX TIP (DISPOSABLE) ×1
HOLDER FOLEY CATH W/STRAP (MISCELLANEOUS) IMPLANT
HOOD PEEL AWAY FLYTE STAYCOOL (MISCELLANEOUS) ×6 IMPLANT
IMMOBILIZER KNEE 20 (SOFTGOODS) ×2
IMMOBILIZER KNEE 20 THIGH 36 (SOFTGOODS) ×1 IMPLANT
MANIFOLD NEPTUNE II (INSTRUMENTS) ×2 IMPLANT
NEEDLE HYPO 22GX1.5 SAFETY (NEEDLE) ×2 IMPLANT
NS IRRIG 1000ML POUR BTL (IV SOLUTION) ×2 IMPLANT
PACK ICE MAXI GEL EZY WRAP (MISCELLANEOUS) ×2 IMPLANT
PACK TOTAL KNEE CUSTOM (KITS) ×2 IMPLANT
PADDING CAST COTTON 6X4 STRL (CAST SUPPLIES) ×2 IMPLANT
PIN STEINMAN FIXATION KNEE (PIN) ×1 IMPLANT
PIN THREADED HEADED SIGMA (PIN) ×1 IMPLANT
PROTECTOR NERVE ULNAR (MISCELLANEOUS) ×2 IMPLANT
SET HNDPC FAN SPRY TIP SCT (DISPOSABLE) ×1 IMPLANT
STAPLER VISISTAT 35W (STAPLE) IMPLANT
STRIP CLOSURE SKIN 1/2X4 (GAUZE/BANDAGES/DRESSINGS) ×1 IMPLANT
SUT MNCRL AB 3-0 PS2 18 (SUTURE) ×2 IMPLANT
SUT VIC AB 0 CT1 36 (SUTURE) ×2 IMPLANT
SUT VIC AB 1 CT1 36 (SUTURE) ×4 IMPLANT
SUT VIC AB 2-0 CT1 27 (SUTURE) ×2
SUT VIC AB 2-0 CT1 TAPERPNT 27 (SUTURE) ×2 IMPLANT
SYR CONTROL 10ML LL (SYRINGE) ×4 IMPLANT
TIBIAL BASE ROT PLAT SZ 7 KNEE (Knees) ×2 IMPLANT
TRAY FOLEY MTR SLVR 16FR STAT (SET/KITS/TRAYS/PACK) ×2 IMPLANT
WATER STERILE IRR 1000ML POUR (IV SOLUTION) ×4 IMPLANT
WRAP KNEE MAXI GEL POST OP (GAUZE/BANDAGES/DRESSINGS) ×1 IMPLANT
YANKAUER SUCT BULB TIP 10FT TU (MISCELLANEOUS) ×2 IMPLANT

## 2018-06-24 NOTE — Plan of Care (Signed)

## 2018-06-24 NOTE — Evaluation (Signed)
Physical Therapy Evaluation Patient Details Name: Christy Baldwin MRN: [id number] DOB: [birth year] Today's Date: 06/24/2018   History of Present Illness  Pt s/p L TKR and with hx of DM, back surgery and peripheral neuropathy  Clinical Impression  Pt s/p L TKR and presents with decreased L LE strength/ROM and post op pain limiting functional mobility.  Pt should progress to dc home with assist of family.    Follow Up Recommendations Home health PT;Follow surgeon's recommendation for DC plan and follow-up therapies    Equipment Recommendations  None recommended by PT    Recommendations for Other Services       Precautions / Restrictions Precautions Precautions: Knee;Fall Required Braces or Orthoses: Knee Immobilizer - Left Knee Immobilizer - Left: Discontinue once straight leg raise with < 10 degree lag Restrictions Weight Bearing Restrictions: No Other Position/Activity Restrictions: WBAT      Mobility  Bed Mobility Overal bed mobility: Needs Assistance Bed Mobility: Supine to Sit     Supine to sit: Mod assist     General bed mobility comments: cues for sequence and use of R LE to self assist  Transfers Overall transfer level: Needs assistance Equipment used: Rolling walker (2 wheeled) Transfers: Sit to/from Stand Sit to Stand: Min assist;Mod assist;From elevated surface         General transfer comment: cues for LE management and use of UEs to self assist  Ambulation/Gait Ambulation/Gait assistance: Min assist Gait Distance (Feet): 25 Feet Assistive device: Rolling walker (2 wheeled) Gait Pattern/deviations: Step-to pattern;Decreased step length - right;Decreased step length - left;Shuffle;Trunk flexed Gait velocity: decr   General Gait Details: cues for sequence, posture and position from [employer]            Wheelchair Mobility    Modified Rankin (Stroke Patients Only)       Balance Overall balance assessment: Mild deficits observed, not  formally tested                                           Pertinent Vitals/Pain Pain Assessment: 0-10 Pain Score: 5  Pain Location: L knee Pain Descriptors / Indicators: Aching;Sore Pain Intervention(s): Limited activity within patient's tolerance;Monitored during session;Premedicated before session;Ice applied    Home Living Family/patient expects to be discharged to:: Private residence Living Arrangements: Other relatives Available Help at Discharge: Family Type of Home: House Home Access: Stairs to enter Entrance Stairs-Rails: Right Entrance Stairs-Number of Steps: 3+1 Home Layout: One level Home Equipment: [place] - single point;Walker - 2 wheels      Prior Function Level of Independence: Independent               Hand Dominance        Extremity/Trunk Assessment   Upper Extremity Assessment Upper Extremity Assessment: Overall WFL for tasks assessed    Lower Extremity Assessment Lower Extremity Assessment: LLE deficits/detail       Communication   Communication: No difficulties  Cognition Arousal/Alertness: Awake/alert Behavior During Therapy: WFL for tasks assessed/performed Overall Cognitive Status: Within Functional Limits for tasks assessed                                        General Comments      Exercises Total Joint Exercises Ankle Circles/Pumps: AROM;Both;15 reps;Supine   Assessment/Plan  PT Assessment Patient needs continued PT services  PT Problem List Decreased strength;Decreased range of motion;Decreased activity tolerance;Decreased mobility;Decreased knowledge of use of DME;Pain       PT Treatment Interventions DME instruction;Gait training;Stair training;Functional mobility training;Therapeutic activities;Therapeutic exercise;Patient/family education    PT Goals (Current goals can be found in the Care Plan section)  Acute Rehab PT Goals Patient Stated Goal: Regain IND PT Goal Formulation:  With patient Time For Goal Achievement: 07/01/18 Potential to Achieve Goals: Good    Frequency 7X/week   Barriers to discharge        Co-evaluation               AM-PAC PT "6 Clicks" Mobility  Outcome Measure Help needed turning from your back to your side while in a flat bed without using bedrails?: A Little Help needed moving from lying on your back to sitting on the side of a flat bed without using bedrails?: A Lot Help needed moving to and from a bed to a chair (including a wheelchair)?: A Lot Help needed standing up from a chair using your arms (e.g., wheelchair or bedside chair)?: A Lot Help needed to walk in hospital room?: A Little Help needed climbing 3-5 steps with a railing? : A Lot 6 Click Score: 14    End of Session Equipment Utilized During Treatment: Gait belt;Left knee immobilizer Activity Tolerance: Patient tolerated treatment well;Patient limited by fatigue Patient left: in bed;with call bell/phone within reach;with family/visitor present;with bed alarm set Nurse Communication: Mobility status PT Visit Diagnosis: Difficulty in walking, not elsewhere classified (R26.2)    Time: 9798-9211 PT Time Calculation (min) (ACUTE ONLY): 36 min   Charges:   PT Evaluation $PT Eval Low Complexity: 1 Low PT Treatments $Gait Training: 8-22 mins        [place] Pager [phone] Office [phone]   Khamani Daniely 06/24/2018, 5:56 PM

## 2018-06-24 NOTE — Anesthesia Procedure Notes (Signed)
Anesthesia Regional Block: Adductor canal block   Pre-Anesthetic Checklist: ,, timeout performed, Correct Patient, Correct Site, Correct Laterality, Correct Procedure, Correct Position, site marked, Risks and benefits discussed,  Surgical consent,  Pre-op evaluation,  At surgeon's request and post-op pain management  Laterality: Lower and Left  Prep: chloraprep       Needles:  Injection technique: Single-shot  Needle Type: Echogenic Needle     Needle Length: 9cm  Needle Gauge: 22     Additional Needles:   Procedures:,,,, ultrasound used (permanent image in chart),,,,  Narrative:  Start time: 06/24/2018 8:40 AM End time: 06/24/2018 8:47 AM Injection made incrementally with aspirations every 5 mL.  Performed by: Personally  Anesthesiologist: Barnet Glasgow, MD  Additional Notes: Block assessed prior to surgery. Pt tolerated procedure well.

## 2018-06-24 NOTE — Progress Notes (Signed)
AssistedDr. Houser with left, ultrasound guided, adductor canal block. Side rails up, monitors on throughout procedure. See vital signs in flow sheet. Tolerated Procedure well.  

## 2018-06-24 NOTE — Anesthesia Procedure Notes (Signed)
Procedure Name: Intubation Date/Time: 06/24/2018 9:49 AM Performed by: Niel Hummer, CRNA Pre-anesthesia Checklist: Patient being monitored, Suction available, Emergency Drugs available and Patient identified Patient Re-evaluated:Patient Re-evaluated prior to induction Oxygen Delivery Method: Circle system utilized Preoxygenation: Pre-oxygenation with 100% oxygen Induction Type: IV induction Ventilation: Mask ventilation without difficulty and Oral airway inserted - appropriate to patient size Laryngoscope Size: Mac and 4 Grade View: Grade I Tube type: Oral Tube size: 7.0 mm Number of attempts: 1 Airway Equipment and Method: Stylet Placement Confirmation: ETT inserted through vocal cords under direct vision,  positive ETCO2 and breath sounds checked- equal and bilateral Secured at: 21 cm Tube secured with: Tape Dental Injury: Teeth and Oropharynx as per pre-operative assessment

## 2018-06-24 NOTE — Care Plan (Signed)
Ortho Bundle Case Management Note  Patient Details  Name: Christy Baldwin MRN: [id number] Date of Birth: 1950/10/10   Spoke with patient prior to surgery. She will discharge to her sister's home in [place] "for as long as needed"  [place]  Alex, [place] referral to [facility] and rolling walker ordered.                   DME Arranged:  Gilford Rile rolling, CPM DME Agency:  [employer]  HH Arranged:  PT [place] Agency:  [facility] ([facility])  Additional Comments: Please contact me with any questions of if this plan should need to change.  Ladell Heads,  [place] Specialist  [phone] 06/24/2018, 11:16 AM

## 2018-06-24 NOTE — Anesthesia Postprocedure Evaluation (Signed)
Anesthesia Post Note  Patient: Christy Baldwin  Procedure(s) Performed: TOTAL KNEE ARTHROPLASTY (Left Knee)     Patient location during evaluation: PACU Anesthesia Type: General Level of consciousness: awake and alert Pain management: pain level controlled Vital Signs Assessment: post-procedure vital signs reviewed and stable Respiratory status: spontaneous breathing, nonlabored ventilation, respiratory function stable and patient connected to nasal cannula oxygen Cardiovascular status: blood pressure returned to baseline and stable Postop Assessment: no apparent nausea or vomiting Anesthetic complications: no    Last Vitals:  Vitals:   06/24/18 1350 06/24/18 1450  BP: 130/83 122/75  Pulse: 99 98  Resp: 16 16  Temp: 36.8 C 36.9 C  SpO2: 97% 98%    Last Pain:  Vitals:   06/24/18 1450  TempSrc: Oral  PainSc:                  Barnet Glasgow

## 2018-06-24 NOTE — Discharge Instructions (Signed)

## 2018-06-24 NOTE — Brief Op Note (Signed)
06/24/2018  2:34 PM  PATIENT:  Christy Baldwin  68 y.o. female  PRE-OPERATIVE DIAGNOSIS:  OSTEOARTHRITIS LEFT KNEE  POST-OPERATIVE DIAGNOSIS:  OSTEOARTHRITIS LEFT KNEE  PROCEDURE:  Procedure(s): TOTAL KNEE ARTHROPLASTY (Left)  SURGEON:  Surgeon(s) and Role:    Dorna Leitz, MD - Primary  PHYSICIAN ASSISTANT:   ASSISTANTS: jim bethune   ANESTHESIA:   general  EBL:  20 mL   BLOOD ADMINISTERED:none  DRAINS: none   LOCAL MEDICATIONS USED:  MARCAINE    and OTHER experel  SPECIMEN:  No Specimen  DISPOSITION OF SPECIMEN:  N/A  COUNTS:  YES  TOURNIQUET:   Total Tourniquet Time Documented: Thigh (Left) - 48 minutes Total: Thigh (Left) - 48 minutes   DICTATION: .Other Dictation: Dictation Number [id number]  PLAN OF CARE: Admit to inpatient   PATIENT DISPOSITION:  PACU - hemodynamically stable.   Delay start of Pharmacological VTE agent (>24hrs) due to surgical blood loss or risk of bleeding: no

## 2018-06-24 NOTE — Transfer of Care (Signed)
Immediate Anesthesia Transfer of Care Note  Patient: Christy Baldwin  Procedure(s) Performed: Procedure(s): TOTAL KNEE ARTHROPLASTY (Left)  Patient Location: PACU  Anesthesia Type:GA combined with regional for post-op pain  Level of Consciousness:  sedated, patient cooperative and responds to stimulation  Airway & Oxygen Therapy:Patient Spontanous Breathing and Patient connected to face mask oxgen  Post-op Assessment:  Report given to PACU RN and Post -op Vital signs reviewed and stable  Post vital signs:  Reviewed and stable  Last Vitals:  Vitals:   06/24/18 0920 06/24/18 1130  BP:  140/80  Pulse:  (!) 101  Resp:  15  Temp:    SpO2: 34% 91%    Complications: No apparent anesthesia complications

## 2018-06-24 NOTE — H&P (Signed)
TOTAL KNEE ADMISSION H&P  Patient is being admitted for left total knee arthroplasty.  Subjective:  Chief Complaint:left knee pain.  HPI: Christy Baldwin, 68 y.o. female, has a history of pain and functional disability in the left knee due to arthritis and has failed non-surgical conservative treatments for greater than 12 weeks to includeNSAID's and/or analgesics, corticosteriod injections, viscosupplementation injections and activity modification.  Onset of symptoms was gradual, starting 4 years ago with gradually worsening course since that time. The patient noted no past surgery on the left knee(s).  Patient currently rates pain in the left knee(s) at 8 out of 10 with activity. Patient has night pain, worsening of pain with activity and weight bearing, pain that interferes with activities of daily living and joint swelling.  Patient has evidence of subchondral sclerosis, periarticular osteophytes and joint space narrowing by imaging studies. This patient has had Failure of all reasonable conservative care. There is no active infection.  Patient Active Problem List   Diagnosis Date Noted  . CANDIDIASIS OF MOUTH 08/07/2009  . MIGRAINE HEADACHE 08/07/2009  . SLEEP DISORDER 08/07/2009  . POSTMENOPAUSAL STATUS 05/01/2009  . ARTHRITIS, LEFT KNEE 01/29/2009  . SINUSITIS, ACUTE 11/01/2008  . DIABETIC PERIPHERAL NEUROPATHY 10/19/2008  . FATIGUE 10/19/2008  . RESTLESS LEG SYNDROME 02/23/2008  . HYPERTRIGLYCERIDEMIA 01/05/2008  . SPRAIN&STRAIN OTHER SPECIFIED SITES KNEE&LEG 01/05/2008  . DEGENERATIVE DISC DISEASE 09/15/2007  . BACK PAIN, LUMBAR 09/13/2007  . CARPAL TUNNEL SYNDROME, HX OF 09/13/2007  . URINARY INCONTINENCE 07/14/2007  . SHOULDER INJURY, LEFT 07/14/2007  . ANXIETY DEPRESSION 07/13/2007  . DIABETES MELLITUS, TYPE II 05/26/2007  . HYPERTENSION, BENIGN ESSENTIAL 05/26/2007  . GERD 05/26/2007  . HYPOTHYROIDISM 05/17/2007  . LIVER FUNCTION TESTS, ABNORMAL 05/17/2007  .  DIVERTICULOSIS OF COLON 11/24/2003   Past Medical History:  Diagnosis Date  . Arthritis   . Depression   . Diabetes mellitus without complication ([place])   . Headache   . Hypertension   . Hypothyroidism     Past Surgical History:  Procedure Laterality Date  . ABDOMINAL HYSTERECTOMY    . APPENDECTOMY    . BACK SURGERY     L4-L5  . BREAST BIOPSY    . CARPAL TUNNEL RELEASE Bilateral   . CHOLECYSTECTOMY    . LAPAROSCOPY Bilateral    Knee  . RECTAL PROLAPSE REPAIR    . TONSILLECTOMY    . TRIGGER FINGER RELEASE    . TUBAL LIGATION      Current Facility-Administered Medications  Medication Dose Route Frequency Provider Last Rate Last Dose  . bupivacaine liposome (EXPAREL) 1.3 % injection 266 mg  20 mL Infiltration Once Dorna Leitz, MD       Current Outpatient Medications  Medication Sig Dispense Refill Last Dose  . APPLE CIDER VINEGAR PO Take 450 mg by mouth at bedtime.     Marland Kitchen aspirin EC 81 MG tablet Take 81 mg by mouth daily.     . cloNIDine (CATAPRES) 0.3 MG tablet Take 0.3 mg by mouth at bedtime.     Marland Kitchen CRANBERRY-VITAMIN C PO Take 2 tablets by mouth daily.     Marland Kitchen gabapentin (NEURONTIN) 300 MG capsule Take 300 mg by mouth at bedtime.     Marland Kitchen ibuprofen (ADVIL,MOTRIN) 200 MG tablet Take 800 mg by mouth 3 (three) times daily as needed for headache or moderate pain.     . Insulin Glargine (BASAGLAR KWIKPEN) 100 UNIT/ML SOPN Inject 65-75 Units into the skin See admin instructions. Inject 65 units in the  morning and 75 units in the evening     . insulin regular (NOVOLIN R,HUMULIN R) 100 units/mL injection Inject 30-50 Units into the skin 3 (three) times daily before meals.     Marland Kitchen levothyroxine (SYNTHROID, LEVOTHROID) 150 MCG tablet Take 150 mcg by mouth daily before breakfast.     . Menthol, Topical Analgesic, (ICY HOT EX) Apply 1 application topically 2 (two) times daily as needed (knee pain).     . Omega-3 Fatty Acids (FISH OIL) 1000 MG CAPS Take 1,000 mg by mouth daily.     Marland Kitchen omeprazole  (PRILOSEC OTC) 20 MG tablet Take 20 mg by mouth daily.     Marland Kitchen PARoxetine (PAXIL) 20 MG tablet Take 20 mg by mouth at bedtime.     . Prenatal Vit-Fe Fumarate-FA (PRENATAL PO) Take 1 capsule by mouth daily.      No Known Allergies  Social History   Tobacco Use  . Smoking status: Never Smoker  . Smokeless tobacco: Never Used  Substance Use Topics  . Alcohol use: Never    Frequency: Never    No family history on file.   ROS ROS: I have reviewed the patient's review of systems thoroughly and there are no positive responses as relates to the HPI. Objective:  Physical Exam  Vital signs in last 24 hours:   Well-developed well-nourished patient in no acute distress. Alert and oriented x3 HEENT:within normal limits Cardiac: Regular rate and rhythm Pulmonary: Lungs clear to auscultation Abdomen: Soft and nontender.  Normal active bowel sounds  Musculoskeletal: (Left knee: Limited range of motion.  Painful range of motion.  No instability.  Trace effusion.  Neurovascular intact distally. Labs: Recent Results (from the past 2160 hour(s))  Glucose, capillary     Status: Abnormal   Collection Time: 06/17/18 12:10 PM  Result Value Ref Range   Glucose-Capillary 46 (L) 70 - 99 mg/dL  Glucose, capillary     Status: None   Collection Time: 06/17/18 12:29 PM  Result Value Ref Range   Glucose-Capillary 92 70 - 99 mg/dL  ABO/Rh     Status: None   Collection Time: 06/17/18 12:51 PM  Result Value Ref Range   ABO/RH(D)      A POS Performed at [facility], [place] [address] [place], [place] [address]   APTT     Status: None   Collection Time: 06/17/18 12:59 PM  Result Value Ref Range   aPTT 29 24 - 36 seconds    Comment: Performed at [facility], [place] [address] [place],  [address]  CBC WITH DIFFERENTIAL     Status: Abnormal   Collection Time: 06/17/18 12:59 PM  Result Value Ref Range   WBC 3.6 (L) 4.0 - 10.5 K/uL   RBC 3.60 (L) 3.87 - 5.11  MIL/uL   Hemoglobin 11.5 (L) 12.0 - 15.0 g/dL   HCT 35.5 (L) 36.0 - 46.0 %   MCV 98.6 80.0 - 100.0 fL   MCH 31.9 26.0 - 34.0 pg   MCHC 32.4 30.0 - 36.0 g/dL   RDW 14.5 11.5 - 15.5 %   Platelets 84 (L) 150 - 400 K/uL    Comment: Immature Platelet Fraction may be clinically indicated, consider ordering this additional test [id number]    nRBC 0.0 0.0 - 0.2 %   Neutrophils Relative % 60 %   Neutro Abs 2.2 1.7 - 7.7 K/uL   Lymphocytes Relative 29 %   Lymphs Abs 1.0 0.7 - 4.0 K/uL  Monocytes Relative 9 %   Monocytes Absolute 0.3 0.1 - 1.0 K/uL   Eosinophils Relative 1 %   Eosinophils Absolute 0.0 0.0 - 0.5 K/uL   Basophils Relative 1 %   Basophils Absolute 0.0 0.0 - 0.1 K/uL   Immature Granulocytes 0 %   Abs Immature Granulocytes 0.01 0.00 - 0.07 K/uL    Comment: Performed at [facility], [place] [address], [place], [place] [address]  Comprehensive metabolic panel     Status: Abnormal   Collection Time: 06/17/18 12:59 PM  Result Value Ref Range   Sodium 143 135 - 145 mmol/L   Potassium 3.8 3.5 - 5.1 mmol/L   Chloride 105 98 - 111 mmol/L   CO2 28 22 - 32 mmol/L   Glucose, Bld 134 (H) 70 - 99 mg/dL   BUN 9 8 - 23 mg/dL   Creatinine, Ser 0.80 0.44 - 1.00 mg/dL   Calcium 9.1 8.9 - 10.3 mg/dL   Total Protein 6.3 (L) 6.5 - 8.1 g/dL   Albumin 3.8 3.5 - 5.0 g/dL   AST 40 15 - 41 U/L   ALT 33 0 - 44 U/L   Alkaline Phosphatase 41 38 - 126 U/L   Total Bilirubin 1.4 (H) 0.3 - 1.2 mg/dL   GFR calc non Af Amer >60 >60 mL/min   GFR calc Af Amer >60 >60 mL/min   Anion gap 10 5 - 15    Comment: Performed at [facility], [place] [address] [place], [place] [address]  Protime-INR     Status: None   Collection Time: 06/17/18 12:59 PM  Result Value Ref Range   Prothrombin Time 13.1 11.4 - 15.2 seconds   INR 1.00     Comment: Performed at [facility], [place] [address], [place], [place] [address]  Type and screen Order type and screen if day  of surgery is less than 15 days from draw of preadmission visit or order morning of surgery if day of surgery is greater than 6 days from preadmission visit.     Status: None   Collection Time: 06/17/18 12:59 PM  Result Value Ref Range   ABO/RH(D) A POS    Antibody Screen NEG    Sample Expiration 06/27/2018    Extend sample reason      NO TRANSFUSIONS OR PREGNANCY IN THE PAST 3 MONTHS Performed at [facility], [place] [address] [place], [place] [address]   Urinalysis, Routine w reflex microscopic     Status: Abnormal   Collection Time: 06/17/18 12:59 PM  Result Value Ref Range   Color, Urine YELLOW YELLOW   APPearance CLEAR CLEAR   Specific Gravity, Urine 1.014 1.005 - 1.030   pH 6.0 5.0 - 8.0   Glucose, UA NEGATIVE NEGATIVE mg/dL   Hgb urine dipstick NEGATIVE NEGATIVE   Bilirubin Urine NEGATIVE NEGATIVE   Ketones, ur NEGATIVE NEGATIVE mg/dL   Protein, ur NEGATIVE NEGATIVE mg/dL   Nitrite NEGATIVE NEGATIVE   Leukocytes, UA SMALL (A) NEGATIVE   RBC / HPF 0-5 0 - 5 RBC/hpf   WBC, UA 0-5 0 - 5 WBC/hpf   Bacteria, UA RARE (A) NONE SEEN   Squamous Epithelial / LPF 0-5 0 - 5   Mucus PRESENT     Comment: Performed at [facility], [place] [address] [place], [place] [address]  Surgical pcr screen     Status: None   Collection Time: 06/17/18 12:59 PM  Result Value Ref Range  MRSA, PCR NEGATIVE NEGATIVE   Staphylococcus aureus NEGATIVE NEGATIVE    Comment: (NOTE) The Xpert SA Assay (FDA approved for NASAL specimens in patients 68 years of age and older), is one component of a comprehensive surveillance program. It is not intended to diagnose infection nor to guide or monitor treatment. Performed at [facility], [place] [address], [place], [place] [address]   Hemoglobin A1c     Status: Abnormal   Collection Time: 06/17/18 12:59 PM  Result Value Ref Range   Hgb A1c MFr Bld 6.8 (H) 4.8 - 5.6 %    Comment: (NOTE) Pre diabetes:           5.7%-6.4% Diabetes:              >6.4% Glycemic control for   <7.0% adults with diabetes    Mean Plasma Glucose 148.46 mg/dL    Comment: Performed at [place] [address] [place], [place] [address]     Estimated body mass index is 31.96 kg/m as calculated from the following:   Height as of 06/17/18: 5' 7.5" (1.715 m).   Weight as of 06/17/18: 94 kg.   Imaging Review Plain radiographs demonstrate severe degenerative joint disease of the left knee(s). The overall alignment ismild varus. The bone quality appears to be fair for age and reported activity level.   Preoperative templating of the joint replacement has been completed, documented, and submitted to the Operating Room personnel in order to optimize intra-operative equipment management.    Patient's anticipated LOS is less than 2 midnights, meeting these requirements: - Younger than 59 - Lives within 1 hour of care - Has a competent adult at home to recover with post-op recover - NO history of  - Chronic pain requiring opiods  - Diabetes  - Coronary Artery Disease  - Heart failure  - Heart attack  - Stroke  - DVT/VTE  - Cardiac arrhythmia  - Respiratory Failure/COPD  - Renal failure  - Anemia  - Advanced Liver disease        Assessment/Plan:  End stage arthritis, left knee   The patient history, physical examination, clinical judgment of the provider and imaging studies are consistent with end stage degenerative joint disease of the left knee(s) and total knee arthroplasty is deemed medically necessary. The treatment options including medical management, injection therapy arthroscopy and arthroplasty were discussed at length. The risks and benefits of total knee arthroplasty were presented and reviewed. The risks due to aseptic loosening, infection, stiffness, patella tracking problems, thromboembolic complications and other imponderables were discussed. The patient acknowledged the explanation,  agreed to proceed with the plan and consent was signed. Patient is being admitted for inpatient treatment for surgery, pain control, PT, OT, prophylactic antibiotics, VTE prophylaxis, progressive ambulation and ADL's and discharge planning. The patient is planning to be discharged home with home health services

## 2018-06-25 LAB — CBC
HEMATOCRIT: 31.4 % — AB (ref 36.0–46.0)
Hemoglobin: 10 g/dL — ABNORMAL LOW (ref 12.0–15.0)
MCH: 31.3 pg (ref 26.0–34.0)
MCHC: 31.8 g/dL (ref 30.0–36.0)
MCV: 98.4 fL (ref 80.0–100.0)
Platelets: 95 10*3/uL — ABNORMAL LOW (ref 150–400)
RBC: 3.19 MIL/uL — ABNORMAL LOW (ref 3.87–5.11)
RDW: 13.6 % (ref 11.5–15.5)
WBC: 4.7 10*3/uL (ref 4.0–10.5)
nRBC: 0 % (ref 0.0–0.2)

## 2018-06-25 LAB — BASIC METABOLIC PANEL
Anion gap: 7 (ref 5–15)
BUN: 15 mg/dL (ref 8–23)
CO2: 26 mmol/L (ref 22–32)
Calcium: 8.6 mg/dL — ABNORMAL LOW (ref 8.9–10.3)
Chloride: 104 mmol/L (ref 98–111)
Creatinine, Ser: 0.88 mg/dL (ref 0.44–1.00)
GFR calc Af Amer: >60 mL/min (ref >60)
GFR calc non Af Amer: >60 mL/min (ref >60)
GLUCOSE: 327 mg/dL — AB (ref 70–99)
Potassium: 5.2 mmol/L — ABNORMAL HIGH (ref 3.5–5.1)
Sodium: 137 mmol/L (ref 135–145)

## 2018-06-25 LAB — GLUCOSE, CAPILLARY
GLUCOSE-CAPILLARY: 284 mg/dL — AB (ref 70–99)
Glucose-Capillary: 346 mg/dL — ABNORMAL HIGH (ref 70–99)
Glucose-Capillary: 326 mg/dL — ABNORMAL HIGH (ref 70–99)
Glucose-Capillary: 294 mg/dL — ABNORMAL HIGH (ref 70–99)

## 2018-06-25 MED ORDER — SODIUM CHLORIDE 0.9 % IV BOLUS
250.0000 mL | Freq: Once | INTRAVENOUS | Status: AC
  Administered 2018-06-25: 250 mL via INTRAVENOUS

## 2018-06-25 MED ORDER — LIP MEDEX EX OINT
TOPICAL_OINTMENT | CUTANEOUS | Status: AC
  Administered 2018-06-25: 1
  Filled 2018-06-25: qty 7

## 2018-06-25 NOTE — Progress Notes (Signed)
    Patient doing well Today postop day 1 after left total knee replacement by Dr. Berenice Primas.  She has been up a little bit but has not yet done much walking.  Physical therapy is at the bedside.  She reports mild well-controlled left knee pain.  She has been able to eat and urinate without any difficulty.  Positive passing gas.  She does feel a bit fatigued this morning.  Her blood pressure has been running a bit low and we did hold her hypertensive medications this morning.   Physical Exam: Vitals:   06/25/18 1027 06/25/18 1031  BP: (!) 99/59 (!) 93/55  Pulse: 70   Resp: 14   Temp: 97.7 F (36.5 C)   SpO2: 95%    CBC Latest Ref Rng & Units 06/25/2018 06/17/2018 05/01/2009  WBC 4.0 - 10.5 K/uL 4.7 3.6(L) 6.3  Hemoglobin 12.0 - 15.0 g/dL 10.0(L) 11.5(L) 12.2  Hematocrit 36.0 - 46.0 % 31.4(L) 35.5(L) 38.7  Platelets 150 - 400 K/uL 95(L) 84(L) 312   BMP Latest Ref Rng & Units 06/25/2018 06/17/2018 05/01/2009  Glucose 70 - 99 mg/dL 327(H) 134(H) 127(H)  BUN 8 - 23 mg/dL 15 9 19   Creatinine 0.44 - 1.00 mg/dL 0.88 0.80 1.06  Sodium 135 - 145 mmol/L 137 143 143  Potassium 3.5 - 5.1 mmol/L 5.2(H) 3.8 5.3  Chloride 98 - 111 mmol/L 104 105 107  CO2 22 - 32 mmol/L 26 28 21   Calcium 8.9 - 10.3 mg/dL 8.6(L) 9.1 10.0   She is on sliding scale insulin and we will attempt to bring her glucose into better control today.  Her potassium is slightly elevated and she is a bit volume depleted.  We will give her a fluid bolus in hopes of increasing her hypotension as well as addressing her slight hyperkalemia. Lab work will be repeated tomorrow.  Dressing in place, Clean dry and intact covered by an Ace bandage with knee immobilizer in place.  Patient is resting comfortably in her hospital bed and a week.  Distal compartments are soft with 2+ distal pedal pulses. NVI  POD #1 s/p Left total knee replacement by Dr. Berenice Primas  - Hypotension.  Blood pressure medications will be held and fluid bolus will be  administered - Minimal hyperkalemia at 5.2.  Fluid bolus is to be provided which will likely drop this to normal limits given minimal elevation.  BMP will be repeated tomorrow morning - Diabetes with elevated glucose, continue sliding scale in hopes of gaining better control - up with PT/OT, encourage ambulation - Continue current pain regimen  - Prescriptions printed and signed and in chart awaiting discharge - ASA 325 BID For DVT prophylaxis - likely d/c home tomorrow with f/u in 2 weeks

## 2018-06-25 NOTE — Care Management Note (Signed)
Case Management Note  Patient Details  Name: Christy Baldwin MRN: [id number] Date of Birth: 1950-10-13  Subjective/Objective:    S/p L TKA                Action/Plan: NCM spoke to pt and offered choice for HH/CMS list provided/placed on chart. States she has CPM and RW at home. She decided on 3n1 bedside commode. Contacted [facility] for 3n1 for home to be delivered to room prior to dc. She is going to her sister's house, [address], [place] [address]. Will give info to [facility]. Pt agreeable to [facility] for HHPT.   Expected Discharge Date:                 Expected Discharge Plan:  [place]  In-House Referral:  NA  Discharge planning Services  CM Consult  Post Acute Care Choice:  Home Health Choice offered to:  Patient  DME Arranged:  Walker rolling, CPM DME Agency:  [employer]  HH Arranged:  PT [place] Agency:  [facility] (formerly [employer])  Status of Service:  Completed, signed off  If discussed at [employer], dates discussed:    Additional Comments:  Erenest Rasher, RN 06/25/2018, 2:32 PM

## 2018-06-25 NOTE — Op Note (Signed)
NAME: Christy Baldwin, Christy Baldwin MEDICAL RECORD [id number] ACCOUNT [id number] DATE OF BIRTH:Jun 05, 1951 FACILITY: [facility] LOCATION: [id number] PHYSICIAN:Calyssa Zobrist L. Kato Wieczorek, MD  OPERATIVE REPORT  DATE OF PROCEDURE:  06/24/2018  PREOPERATIVE DIAGNOSIS:  End-stage degenerative joint disease, left knee.  POSTOPERATIVE DIAGNOSIS:  End-stage degenerative joint disease, left knee.  PROCEDURE:  Left total knee replacement with an Attune system, size 6 femur, size 7 tibia, 5 mm bridging bearing, and a 41 mm all polyethylene patella.  SURGEON:  Dorna Leitz, MD  ASSISTANT:  Gaspar Skeeters  ANESTHESIA:  General.  BRIEF HISTORY:  The patient is a 68 year old female with a long history of significant complaints of left knee pain.  She was treated conservatively for a prolonged period of time.  After failure of all conservative care, she was taken to the operating  room for left total knee replacement.  The assistant in the case was Gaspar Skeeters, who was present for the entire case and assisted with bone cuts, retraction, and closing to minimize OR time.  BRIEF HISTORY:  The patient is a 68 year old female with a long history of significant complaints of left knee pain.  She has been treated conservatively for a prolonged period of time.  After failure of all conservative care, she was taken to the  operating room for left total knee replacement.  She had night pain and light activity pain.  She had x-rays showing severe bone-on-bone change.  DESCRIPTION OF PROCEDURE:  The patient was taken to the operating room after adequate anesthesia was obtained with a general anesthetic.  The patient was placed on the operating table.  The left leg was prepped and draped in the usual sterile fashion.   Following this, the leg was exsanguinated, blood pressure tourniquet inflated to 300 mmHg.  Following this, a midline incision was made, subcutaneous tissues down to the extensor mechanism.  Medial parapatellar arthrotomy was  undertaken.  Following this,  the medial and lateral menisci were removed, retropatellar fat pad, synovium on the anterior aspect of the femur, and at this point attention was turned to the femur where an intramedullary pilot hole was drilled and a 5-degree valgus inclination cut  was made with 9 mm distal bone resected.  Following this, attention was turned to sizing the femur.  It sized to a 6.  Anterior and posterior cuts were made chamfers and box.  Attention was then turned to the tibia, which was brought forward.  It was cut  perpendicular to its long axis with 3-degree posterior slope, and following this, it was sized to a 7.  It was drilled and keeled and the trials were put in place.  The patella was cut down to a level of 14 mm, and a 41 paddle was chosen and lugs were  drilled.  The knee was put through a range of motion.  Excellent stability and range of motion were achieved.  Attention was then turned away from the removal of the trial components.  The knee was then copiously and thoroughly lavaged.  Pulsatile lavage  irrigation and suctioned dry.  The final components were cemented into place, size 6 femur, size 7 tibia, 5 mm bridging bearing trial was placed and a 41 all poly patella was placed and held with a clamp.  All excess bone cement was removed, and the  cement was allowed to completely harden.  Once the cement was completely hardened, the final trial poly was removed and the final poly was placed.  The knee was irrigated and suctioned dry,  closed in layers, and the medial parapatellar ____ was closed  with #1 Vicryl running.  The skin was closed with 0, 2-0 Vicryl, and 3-0 Monocryl subcuticular.  Benzoin and Steri-Strips were applied.  A sterile compressive dressing was applied.  The patient was taken to recovery and was noted to be in satisfactory  condition.  Estimated blood loss for the procedure was minimal.  LN/NUANCE  D:06/24/2018 T:06/24/2018 JOB:[id number]

## 2018-06-25 NOTE — Progress Notes (Signed)
Physical Therapy Treatment Patient Details Name: Christy Baldwin MRN: [id number] DOB: 1951-06-07 Today's Date: 06/25/2018    History of Present Illness Pt s/p L TKR and with hx of DM, back surgery and peripheral neuropathy    PT Comments    POD # 1 pm session Assisted with amb in hallway then back to bed for CPM.   Follow Up Recommendations  Home health PT;Follow surgeon's recommendation for DC plan and follow-up therapies     Equipment Recommendations  None recommended by PT    Recommendations for Other Services       Precautions / Restrictions Precautions Precautions: Knee;Fall Required Braces or Orthoses: Knee Immobilizer - Left Knee Immobilizer - Left: Discontinue once straight leg raise with < 10 degree lag Restrictions Weight Bearing Restrictions: No Other Position/Activity Restrictions: WBAT    Mobility  Bed Mobility Overal bed mobility: Needs Assistance Bed Mobility: Sit to Supine       Sit to supine: Min assist   General bed mobility comments: assist back to bed for CPM  Transfers Overall transfer level: Needs assistance Equipment used: Rolling walker (2 wheeled) Transfers: Sit to/from [employer] Sit to Stand: Min assist;From elevated surface Stand pivot transfers: Min assist       General transfer comment: cues for LE management and use of UEs to self assist  Ambulation/Gait Ambulation/Gait assistance: Min assist Gait Distance (Feet): 32 Feet Assistive device: Rolling walker (2 wheeled) Gait Pattern/deviations: Step-to pattern;Decreased step length - right;Decreased step length - left;Shuffle;Trunk flexed Gait velocity: decr   General Gait Details: Increased time with cues for sequence, posture and position from [employer]             Wheelchair Mobility    Modified Rankin (Stroke Patients Only)       Balance                                            Cognition Arousal/Alertness:  Awake/alert Behavior During Therapy: WFL for tasks assessed/performed Overall Cognitive Status: Within Functional Limits for tasks assessed                                        Exercises      General Comments        Pertinent Vitals/Pain Pain Assessment: 0-10 Pain Score: 6  Pain Location: L knee Pain Descriptors / Indicators: Aching;Sore Pain Intervention(s): Monitored during session;Repositioned;Ice applied;Patient requesting pain meds-RN notified    Home Living                      Prior Function            PT Goals (current goals can now be found in the care plan section) Progress towards PT goals: Progressing toward goals    Frequency    7X/week      PT Plan Current plan remains appropriate    Co-evaluation              AM-PAC PT "6 Clicks" Mobility   Outcome Measure  Help needed turning from your back to your side while in a flat bed without using bedrails?: A Little Help needed moving from lying on your back to sitting on the side of a flat bed without using bedrails?:  A Little Help needed moving to and from a bed to a chair (including a wheelchair)?: A Little Help needed standing up from a chair using your arms (e.g., wheelchair or bedside chair)?: A Little Help needed to walk in hospital room?: A Little Help needed climbing 3-5 steps with a railing? : A Lot 6 Click Score: 17    End of Session Equipment Utilized During Treatment: Gait belt Activity Tolerance: Patient tolerated treatment well;Patient limited by fatigue Patient left: in bed;with call bell/phone within reach Nurse Communication: Mobility status PT Visit Diagnosis: Difficulty in walking, not elsewhere classified (R26.2)     Time: 7366-8159 PT Time Calculation (min) (ACUTE ONLY): 24 min  Charges:  $Gait Training: 8-22 mins $Self Care/Home Management: 8-22                     Rica Koyanagi  PTA [place] Pager       [phone] Office      [phone]

## 2018-06-25 NOTE — Progress Notes (Signed)
Physical Therapy Treatment Patient Details Name: Christy Baldwin MRN: [id number] DOB: 04-27-1951 Today's Date: 06/25/2018    History of Present Illness Pt s/p L TKR and with hx of DM, back surgery and peripheral neuropathy    PT Comments    Pt cooperative and progressing with mobility but ltd this am by fatigue and c/o dizziness with position change - RN present and assessing BP.   Follow Up Recommendations  Home health PT;Follow surgeon's recommendation for DC plan and follow-up therapies     Equipment Recommendations  None recommended by PT    Recommendations for Other Services       Precautions / Restrictions Precautions Precautions: Knee;Fall Required Braces or Orthoses: Knee Immobilizer - Left Knee Immobilizer - Left: Discontinue once straight leg raise with < 10 degree lag(Pt performed IND SLR this am) Restrictions Weight Bearing Restrictions: No Other Position/Activity Restrictions: WBAT    Mobility  Bed Mobility Overal bed mobility: Needs Assistance Bed Mobility: Supine to Sit     Supine to sit: Min assist;Mod assist     General bed mobility comments: cues for sequence and use of R LE to self assist  Transfers Overall transfer level: Needs assistance Equipment used: Rolling walker (2 wheeled) Transfers: Sit to/from Stand Sit to Stand: Min assist;From elevated surface         General transfer comment: cues for LE management and use of UEs to self assist  Ambulation/Gait Ambulation/Gait assistance: Min assist Gait Distance (Feet): 39 Feet Assistive device: Rolling walker (2 wheeled) Gait Pattern/deviations: Step-to pattern;Decreased step length - right;Decreased step length - left;Shuffle;Trunk flexed Gait velocity: decr   General Gait Details: Increased time with cues for sequence, posture and position from [employer]             Wheelchair Mobility    Modified Rankin (Stroke Patients Only)       Balance Overall balance  assessment: Mild deficits observed, not formally tested                                          Cognition Arousal/Alertness: Awake/alert Behavior During Therapy: WFL for tasks assessed/performed Overall Cognitive Status: Within Functional Limits for tasks assessed                                        Exercises Total Joint Exercises Ankle Circles/Pumps: AROM;Both;15 reps;Supine Quad Sets: AROM;Both;10 reps;Supine Heel Slides: AAROM;Left;15 reps;Supine Straight Leg Raises: AAROM;AROM;Left;15 reps;Supine Goniometric ROM: AAROM L knee -8 - 50    General Comments        Pertinent Vitals/Pain Pain Assessment: 0-10 Pain Score: 5  Pain Location: L knee Pain Descriptors / Indicators: Aching;Sore Pain Intervention(s): Limited activity within patient's tolerance;Monitored during session;Premedicated before session;Ice applied    Home Living                      Prior Function            PT Goals (current goals can now be found in the care plan section) Acute Rehab PT Goals Patient Stated Goal: Regain IND PT Goal Formulation: With patient Time For Goal Achievement: 07/01/18 Potential to Achieve Goals: Good Progress towards PT goals: Progressing toward goals    Frequency    7X/week  PT Plan Current plan remains appropriate    Co-evaluation              AM-PAC PT "6 Clicks" Mobility   Outcome Measure  Help needed turning from your back to your side while in a flat bed without using bedrails?: A Little Help needed moving from lying on your back to sitting on the side of a flat bed without using bedrails?: A Lot Help needed moving to and from a bed to a chair (including a wheelchair)?: A Little Help needed standing up from a chair using your arms (e.g., wheelchair or bedside chair)?: A Lot Help needed to walk in hospital room?: A Little Help needed climbing 3-5 steps with a railing? : A Lot 6 Click Score: 15     End of Session Equipment Utilized During Treatment: Gait belt Activity Tolerance: Patient tolerated treatment well;Patient limited by fatigue Patient left: in chair;with call bell/phone within reach Nurse Communication: Mobility status PT Visit Diagnosis: Difficulty in walking, not elsewhere classified (R26.2)     Time: 5784-6962 PT Time Calculation (min) (ACUTE ONLY): 42 min  Charges:  $Gait Training: 23-37 mins $Therapeutic Exercise: 8-22 mins                     [place] Pager [phone] Office [phone]    Katharyn Schauer 06/25/2018, 10:35 AM

## 2018-06-25 NOTE — Plan of Care (Signed)

## 2018-06-25 NOTE — Plan of Care (Signed)
Pt is stable. Plan of care reviewed.

## 2018-06-26 LAB — CBC
HCT: 29 % — ABNORMAL LOW (ref 36.0–46.0)
Hemoglobin: 9.3 g/dL — ABNORMAL LOW (ref 12.0–15.0)
MCH: 31.3 pg (ref 26.0–34.0)
MCHC: 32.1 g/dL (ref 30.0–36.0)
MCV: 97.6 fL (ref 80.0–100.0)
Platelets: 100 10*3/uL — ABNORMAL LOW (ref 150–400)
RBC: 2.97 MIL/uL — AB (ref 3.87–5.11)
RDW: 13.6 % (ref 11.5–15.5)
WBC: 6.6 10*3/uL (ref 4.0–10.5)
nRBC: 0 % (ref 0.0–0.2)

## 2018-06-26 LAB — BASIC METABOLIC PANEL
ANION GAP: 6 (ref 5–15)
BUN: 22 mg/dL (ref 8–23)
CHLORIDE: 105 mmol/L (ref 98–111)
CO2: 25 mmol/L (ref 22–32)
Calcium: 8.8 mg/dL — ABNORMAL LOW (ref 8.9–10.3)
Creatinine, Ser: 0.91 mg/dL (ref 0.44–1.00)
GFR calc Af Amer: >60 mL/min (ref >60)
GFR calc non Af Amer: >60 mL/min (ref >60)
Glucose, Bld: 379 mg/dL — ABNORMAL HIGH (ref 70–99)
POTASSIUM: 4.9 mmol/L (ref 3.5–5.1)
Sodium: 136 mmol/L (ref 135–145)

## 2018-06-26 LAB — GLUCOSE, CAPILLARY
GLUCOSE-CAPILLARY: 202 mg/dL — AB (ref 70–99)
GLUCOSE-CAPILLARY: 173 mg/dL — AB (ref 70–99)
Glucose-Capillary: 345 mg/dL — ABNORMAL HIGH (ref 70–99)
Glucose-Capillary: 241 mg/dL — ABNORMAL HIGH (ref 70–99)

## 2018-06-26 NOTE — Progress Notes (Signed)
Physical Therapy Treatment Patient Details Name: Christy Baldwin MRN: [id number] DOB: 11/20/1950 Today's Date: 06/26/2018    History of Present Illness Pt s/p L TKR and with hx of DM, back surgery and peripheral neuropathy    PT Comments    Progressing with mobility. Plan is for d/c home on Monday with HHPT f/u.    Follow Up Recommendations  Follow surgeon's recommendation for DC plan and follow-up therapies     Equipment Recommendations  None recommended by PT    Recommendations for Other Services       Precautions / Restrictions Precautions Precautions: Fall;Knee Required Braces or Orthoses: Knee Immobilizer - Left Knee Immobilizer - Left: Discontinue once straight leg raise with < 10 degree lag Restrictions Weight Bearing Restrictions: No Other Position/Activity Restrictions: WBAT    Mobility  Bed Mobility Overal bed mobility: Needs Assistance Bed Mobility: Supine to Sit;Sit to Supine     Supine to sit: Min assist;HOB elevated Sit to supine: Min assist;HOB elevated   General bed mobility comments: Assist for LE.   Transfers Overall transfer level: Needs assistance Equipment used: Rolling walker (2 wheeled) Transfers: Sit to/from Stand Sit to Stand: Min guard;From elevated surface         General transfer comment: Close guard for safety.   Ambulation/Gait Ambulation/Gait assistance: Min guard Gait Distance (Feet): 80 Feet Assistive device: Rolling walker (2 wheeled) Gait Pattern/deviations: Step-to pattern;Step-through pattern;Decreased stride length     General Gait Details: close guard for safety. slow gait speed.    Stairs    Wheelchair Mobility    Modified Rankin (Stroke Patients Only)       Balance Overall balance assessment: Mild deficits observed, not formally tested                                          Cognition Arousal/Alertness: Awake/alert Behavior During Therapy: WFL for tasks  assessed/performed Overall Cognitive Status: Within Functional Limits for tasks assessed                                        Exercises Total Joint Exercises Ankle Circles/Pumps: AROM;Both;Supine;10 reps Quad Sets: AROM;Both;10 reps;Supine Heel Slides: AAROM;Left;Supine;10 reps Hip ABduction/ADduction: AAROM;Left;10 reps;Supine Straight Leg Raises: AAROM;Left;10 reps;Supine Goniometric ROM: ~5-55 degrees    General Comments        Pertinent Vitals/Pain Pain Assessment: 0-10 Pain Score: 7  Pain Location: L knee Pain Descriptors / Indicators: Aching;Sore Pain Intervention(s): Monitored during session;Repositioned;Ice applied;Heat applied(heat to thigh)    Home Living                      Prior Function            PT Goals (current goals can now be found in the care plan section) Progress towards PT goals: Progressing toward goals    Frequency    7X/week      PT Plan Current plan remains appropriate    Co-evaluation              AM-PAC PT "6 Clicks" Mobility   Outcome Measure  Help needed turning from your back to your side while in a flat bed without using bedrails?: A Little Help needed moving from lying on your back to sitting on the side of a flat bed  without using bedrails?: A Little Help needed moving to and from a bed to a chair (including a wheelchair)?: A Little Help needed standing up from a chair using your arms (e.g., wheelchair or bedside chair)?: A Little Help needed to walk in hospital room?: A Little Help needed climbing 3-5 steps with a railing? : A Little 6 Click Score: 18    End of Session Equipment Utilized During Treatment: Gait belt Activity Tolerance: Patient tolerated treatment well Patient left: in bed;with call bell/phone within reach   PT Visit Diagnosis: Difficulty in walking, not elsewhere classified (R26.2)     Time: 5916-3846 PT Time Calculation (min) (ACUTE ONLY): 21 min  Charges:  $Gait  Training: 8-22 mins $Therapeutic Exercise: 8-22 mins                        Weston Anna, [place] Pager: [phone] Office: [phone]

## 2018-06-26 NOTE — Progress Notes (Signed)
   Patient doing well today postop day 2 after left total knee replacement by Dr. Berenice Primas.  She has been up and walking with PT.  Slow steady progress in PT.  She reports mild well-controlled left knee pain.  She has been able to eat and urinate without any difficulty.  Positive passing gas.  She is feeling much better today now that her BP is back up, denies dizziness.    Physical Exam:  BP 136/70   Pulse 87   Temp 99 F (37.2 C) (Oral)   Resp 16   Ht 5' 7.5" (1.715 m)   Wt 94 kg   SpO2 95%   BMI 31.96 kg/m   CBC Latest Ref Rng & Units 06/26/2018 06/25/2018 06/17/2018  WBC 4.0 - 10.5 K/uL 6.6 4.7 3.6(L)  Hemoglobin 12.0 - 15.0 g/dL 9.3(L) 10.0(L) 11.5(L)  Hematocrit 36.0 - 46.0 % 29.0(L) 31.4(L) 35.5(L)  Platelets 150 - 400 K/uL 100(L) 95(L) 84(L)   BMP Latest Ref Rng & Units 06/26/2018 06/25/2018 06/17/2018  Glucose 70 - 99 mg/dL 379(H) 327(H) 134(H)  BUN 8 - 23 mg/dL 22 15 9   Creatinine 0.44 - 1.00 mg/dL 0.91 0.88 0.80  Sodium 135 - 145 mmol/L 136 137 143  Potassium 3.5 - 5.1 mmol/L 4.9 5.2(H) 3.8  Chloride 98 - 111 mmol/L 105 104 105  CO2 22 - 32 mmol/L 25 26 28   Calcium 8.9 - 10.3 mg/dL 8.8(L) 8.6(L) 9.1    Dressing in place, Clean dry and intact covered by an Ace bandage with knee immobilizer in place.  Patient is resting comfortably in her hospital bed and a week.  Distal compartments are soft with 2+ distal pedal pulses. NVI  POD #2 s/p Left total knee replacement by Dr. Berenice Primas  - Hypotension.  much improved and WNL today, asymptomatic now - Diabetes, continue sliding scale in hopes of gaining better control - up with PT/OT, encourage ambulation - Continue current pain regimen             - Prescriptions printed and signed and in chart awaiting discharge - ASA 325 BID For DVT prophylaxis - likely d/c home tomorrow with f/u in 2 weeks

## 2018-06-26 NOTE — Plan of Care (Signed)

## 2018-06-26 NOTE — Plan of Care (Signed)
  Problem: Education: Goal: Knowledge of General Education information will improve Description Including pain rating scale, medication(s)/side effects and non-pharmacologic comfort measures Outcome: Progressing   

## 2018-06-26 NOTE — Progress Notes (Signed)
Physical Therapy Treatment Patient Details Name: Christy Baldwin MRN: [id number] DOB: 08-06-1950 Today's Date: 06/26/2018    History of Present Illness Pt s/p L TKR and with hx of DM, back surgery and peripheral neuropathy    PT Comments    Progressing with mobility.    Follow Up Recommendations  Follow surgeon's recommendation for DC plan and follow-up therapies     Equipment Recommendations  None recommended by PT    Recommendations for Other Services       Precautions / Restrictions Precautions Precautions: Fall;Knee Required Braces or Orthoses: Knee Immobilizer - Left Knee Immobilizer - Left: Discontinue once straight leg raise with < 10 degree lag Restrictions Weight Bearing Restrictions: No Other Position/Activity Restrictions: WBAT    Mobility  Bed Mobility Overal bed mobility: Needs Assistance Bed Mobility: Supine to Sit;Sit to Supine     Supine to sit: Min assist Sit to supine: Min assist   General bed mobility comments: Assist for LE.   Transfers Overall transfer level: Needs assistance Equipment used: Rolling walker (2 wheeled) Transfers: Sit to/from Stand Sit to Stand: Min guard         General transfer comment: Close guard for safety.   Ambulation/Gait Ambulation/Gait assistance: Min guard Gait Distance (Feet): 60 Feet Assistive device: Rolling walker (2 wheeled) Gait Pattern/deviations: Step-to pattern;Step-through pattern;Decreased stride length     General Gait Details: close guard for safety. slow gait speed.    Stairs Stairs: Yes Stairs assistance: Min assist Stair Management: Step to pattern;Forwards;With cane;One rail Right Number of Stairs: 2 General stair comments: up and over portable steps. VCS safety, technique, sequence. Small amount of assist to steady.   Wheelchair Mobility    Modified Rankin (Stroke Patients Only)       Balance Overall balance assessment: Mild deficits observed, not formally tested                                           Cognition Arousal/Alertness: Awake/alert Behavior During Therapy: WFL for tasks assessed/performed Overall Cognitive Status: Within Functional Limits for tasks assessed                                        Exercises Total Joint Exercises Ankle Circles/Pumps: AROM;Both;Supine;10 reps Quad Sets: AROM;Both;10 reps;Supine Heel Slides: AAROM;Left;Supine;10 reps Hip ABduction/ADduction: AAROM;Left;10 reps;Supine Straight Leg Raises: AAROM;Left;10 reps;Supine Goniometric ROM: ~5-55 degrees    General Comments        Pertinent Vitals/Pain Pain Assessment: 0-10 Pain Score: 6  Pain Location: L knee Pain Descriptors / Indicators: Aching;Sore Pain Intervention(s): Monitored during session;Ice applied    Home Living                      Prior Function            PT Goals (current goals can now be found in the care plan section) Progress towards PT goals: Progressing toward goals    Frequency    7X/week      PT Plan Current plan remains appropriate    Co-evaluation              AM-PAC PT "6 Clicks" Mobility   Outcome Measure  Help needed turning from your back to your side while in a flat bed without using bedrails?: A  Little Help needed moving from lying on your back to sitting on the side of a flat bed without using bedrails?: A Little Help needed moving to and from a bed to a chair (including a wheelchair)?: A Little Help needed standing up from a chair using your arms (e.g., wheelchair or bedside chair)?: A Little Help needed to walk in hospital room?: A Little Help needed climbing 3-5 steps with a railing? : A Little 6 Click Score: 18    End of Session Equipment Utilized During Treatment: Gait belt;Left knee immobilizer Activity Tolerance: Patient tolerated treatment well Patient left: in bed;with call bell/phone within reach   PT Visit Diagnosis: Difficulty in walking, not  elsewhere classified (R26.2)     Time: 4315-4008 PT Time Calculation (min) (ACUTE ONLY): 34 min  Charges:  $Gait Training: 8-22 mins $Therapeutic Exercise: 8-22 mins                        Weston Anna, [place] Pager: [phone] Office: [phone]

## 2018-06-27 DIAGNOSIS — D62 Acute posthemorrhagic anemia: Secondary | ICD-10-CM

## 2018-06-27 LAB — CBC
HCT: 28.9 % — ABNORMAL LOW (ref 36.0–46.0)
Hemoglobin: 9.2 g/dL — ABNORMAL LOW (ref 12.0–15.0)
MCH: 31.5 pg (ref 26.0–34.0)
MCHC: 31.8 g/dL (ref 30.0–36.0)
MCV: 99 fL (ref 80.0–100.0)
Platelets: 106 10*3/uL — ABNORMAL LOW (ref 150–400)
RBC: 2.92 MIL/uL — ABNORMAL LOW (ref 3.87–5.11)
RDW: 14.2 % (ref 11.5–15.5)
WBC: 5.7 10*3/uL (ref 4.0–10.5)
nRBC: 0 % (ref 0.0–0.2)

## 2018-06-27 LAB — GLUCOSE, CAPILLARY: GLUCOSE-CAPILLARY: 144 mg/dL — AB (ref 70–99)

## 2018-06-27 NOTE — Progress Notes (Signed)
Discharge paperwork discussed with pt at the bedside.  She demonstrated understanding.  Pt was escorted in stable condition by wheelchair to main lobby.  

## 2018-06-27 NOTE — Plan of Care (Signed)
Pt alert and oriented, ready for d/c per MD order.  Ace bandage loosened this am, pt c/o of slight pain in calf. PA made aware.  Pt doing well overall. RN will monitor.

## 2018-06-27 NOTE — Progress Notes (Signed)
Physical Therapy Treatment Patient Details Name: Christy Baldwin MRN: [id number] DOB: 04-11-1951 Today's Date: 06/27/2018    History of Present Illness Pt s/p L TKR and with hx of DM, back surgery and peripheral neuropathy    PT Comments    POD # 3 am session Assisted OOB to amb a functional distance in hallway then returned to room Performed some TE's following HEP handout.  Instructed on proper tech, freq as well as use of ICE.   Addressed all mobility questions, discussed appropriate activity, educated on use of ICE.  Pt ready for D/C to home. Pt's sister is sick and plans to pick pt up around 11 am and prefers not to come into hospital.  Arrange pt to be transported via wheelchair when sister arrives.   Follow Up Recommendations  Follow surgeon's recommendation for DC plan and follow-up therapies     Equipment Recommendations  None recommended by PT    Recommendations for Other Services       Precautions / Restrictions Precautions Precautions: Fall;Knee Precaution Comments: pt able to perform SLR Restrictions Weight Bearing Restrictions: No Other Position/Activity Restrictions: WBAT    Mobility  Bed Mobility Overal bed mobility: Needs Assistance Bed Mobility: Supine to Sit;Sit to Supine     Supine to sit: Supervision Sit to supine: Supervision   General bed mobility comments: demonstared and instructed how to use a belt to self assist LE on/off bed   Transfers Overall transfer level: Needs assistance Equipment used: Rolling walker (2 wheeled) Transfers: Sit to/from Stand Sit to Stand: Supervision;Min guard Stand pivot transfers: Supervision;Min guard       General transfer comment: one VC on turn completion otherwise good tech   Ambulation/Gait Ambulation/Gait assistance: Supervision;Min guard Gait Distance (Feet): 55 Feet Assistive device: Rolling walker (2 wheeled) Gait Pattern/deviations: Step-to pattern;Step-through pattern;Decreased stride  length Gait velocity: decreased    General Gait Details: one VC on safety with turns and proper walker to self distance    Stairs         General stair comments: pt practiced stairs yesterday and stated she did not need to practice today.     Wheelchair Mobility    Modified Rankin (Stroke Patients Only)       Balance                                            Cognition Arousal/Alertness: Awake/alert Behavior During Therapy: WFL for tasks assessed/performed Overall Cognitive Status: Within Functional Limits for tasks assessed                                        Exercises   Total Knee Replacement TE's 10 reps B LE ankle pumps 10 reps towel squeezes 10 reps knee presses 10 reps heel slides  10 reps SAQ's 10 reps SLR's 10 reps ABD Followed by ICE     General Comments        Pertinent Vitals/Pain Pain Assessment: 0-10 Pain Score: 4  Pain Location: L knee Pain Descriptors / Indicators: Aching;Sore;Operative site guarding Pain Intervention(s): Monitored during session;Repositioned;Ice applied;Premedicated before session    Home Living                      Prior Function  PT Goals (current goals can now be found in the care plan section) Progress towards PT goals: Progressing toward goals    Frequency    7X/week      PT Plan Current plan remains appropriate    Co-evaluation              AM-PAC PT "6 Clicks" Mobility   Outcome Measure  Help needed turning from your back to your side while in a flat bed without using bedrails?: A Little Help needed moving from lying on your back to sitting on the side of a flat bed without using bedrails?: A Little Help needed moving to and from a bed to a chair (including a wheelchair)?: A Little Help needed standing up from a chair using your arms (e.g., wheelchair or bedside chair)?: A Little Help needed to walk in hospital room?: A Little Help  needed climbing 3-5 steps with a railing? : A Little 6 Click Score: 18    End of Session Equipment Utilized During Treatment: Gait belt Activity Tolerance: Patient tolerated treatment well Patient left: in bed;with call bell/phone within reach Nurse Communication: (pt ready for D/C to home) PT Visit Diagnosis: Difficulty in walking, not elsewhere classified (R26.2)     Time: 4854-6270 PT Time Calculation (min) (ACUTE ONLY): 29 min  Charges:  $Gait Training: 8-22 mins $Therapeutic Exercise: 8-22 mins                     Rica Koyanagi  PTA Acute  Rehabilitation Services Pager      [phone] Office      [phone]

## 2018-06-27 NOTE — Progress Notes (Signed)
Subjective: 3 Days Post-Op Procedure(s) (LRB): TOTAL KNEE ARTHROPLASTY (Left) Patient reports pain as moderate.  Taking by mouth and voiding okay.  Progressing with physical therapy.  Ready to go home today.  Denies dizziness.  Objective: Vital signs in last 24 hours: Temp:  [98.5 F (36.9 C)-99.1 F (37.3 C)] 98.5 F (36.9 C) (01/13 0620) Pulse Rate:  [78-93] 78 (01/13 0620) Resp:  [16-19] 18 (01/13 0620) BP: (101-127)/(65-67) 101/67 (01/13 0620) SpO2:  [93 %-94 %] 94 % (01/13 0620)  Intake/Output from previous day: 01/12 0701 - 01/13 0700 In: 1020 [P.O.:1020] Out: 1650 [Urine:1650] Intake/Output this shift: No intake/output data recorded.  Recent Labs    06/25/18 0414 06/26/18 0333 06/27/18 0409  HGB 10.0* 9.3* 9.2*   Recent Labs    06/26/18 0333 06/27/18 0409  WBC 6.6 5.7  RBC 2.97* 2.92*  HCT 29.0* 28.9*  PLT 100* 106*   Recent Labs    06/25/18 0414 06/26/18 0333  NA 137 136  K 5.2* 4.9  CL 104 105  CO2 26 25  BUN 15 22  CREATININE 0.88 0.91  GLUCOSE 327* 379*  CALCIUM 8.6* 8.8*   No results for input(s): LABPT, INR in the last 72 hours. Left knee exam: Neurovascular intact Sensation intact distally Intact pulses distally Dorsiflexion/Plantar flexion intact Incision: dressing C/D/I Compartment soft  Anticipated LOS equal to or greater than 2 midnights due to - Age 1 and older with one or more of the following:  - Obesity  - Expected need for hospital services (PT, OT, Nursing) required for safe  discharge  - Anticipated need for postoperative skilled nursing care or inpatient rehab  - Active co-morbidities: Diabetes and Anemia OR   - Unanticipated findings during/Post Surgery: Slow post-op progression: GI, pain control, mobility  - Patient is a high risk of re-admission due to: None   Assessment/Plan: 3 Days Post-Op Procedure(s) (LRB): TOTAL KNEE ARTHROPLASTY (Left)  Acute blood loss anemia, expected.  Asymptomatic. Plan: Up with  therapy Discharge home with home health  Weight-bear as tolerated on the left. Follow-up with Dr. Berenice Primas in 10 to 14 days.    Erlene Senters 06/27/2018, 8:16 AM

## 2018-06-27 NOTE — Discharge Summary (Signed)
Patient ID: Christy Baldwin MRN: [id number] DOB/AGE: Aug 28, 1950 68 y.o.  Admit date: 06/24/2018 Discharge date: 06/27/2018  Admission Diagnoses:  Principal Problem:   Primary osteoarthritis of left knee Active Problems:   Diabetes ([place])   Postoperative anemia due to acute blood loss   Discharge Diagnoses:  Same  Past Medical History:  Diagnosis Date  . Arthritis   . Depression   . Diabetes mellitus without complication ([place])   . Headache   . Hypertension   . Hypothyroidism     Surgeries: Procedure(s): Left TOTAL KNEE ARTHROPLASTY on 06/24/2018   Consultants:   Discharged Condition: Improved  Hospital Course: AIRIEL OBLINGER is an 68 y.o. female who was admitted 06/24/2018 for operative treatment ofPrimary osteoarthritis of left knee. Patient has severe unremitting pain that affects sleep, daily activities, and work/hobbies. After pre-op clearance the patient was taken to the operating room on 06/24/2018 and underwent  Procedure(s): Left TOTAL KNEE ARTHROPLASTY.    Patient was given perioperative antibiotics:  Anti-infectives (From admission, onward)   Start     Dose/Rate Route Frequency Ordered Stop   06/24/18 1600  ceFAZolin (ANCEF) IVPB 2g/100 mL premix     2 g 200 mL/hr over 30 Minutes Intravenous Every 6 hours 06/24/18 1247 06/24/18 2200   06/24/18 0730  ceFAZolin (ANCEF) IVPB 2g/100 mL premix     2 g 200 mL/hr over 30 Minutes Intravenous On call to O.R. 06/24/18 0724 06/24/18 1020       Patient was given sequential compression devices, early ambulation, and chemoprophylaxis to prevent DVT.  She was placed on sliding scale insulin during the hospitalization.  Patient benefited maximally from hospital stay and there were no complications.    Recent vital signs:  Patient Vitals for the past 24 hrs:  BP Temp Temp src Pulse Resp SpO2  06/27/18 0620 101/67 98.5 F (36.9 C) Oral 78 18 94 %  06/26/18 2134 127/67 99.1 F (37.3 C) - 93 19 94 %  06/26/18 1408 119/65  98.5 F (36.9 C) - 87 16 93 %     Recent laboratory studies:  Recent Labs    06/25/18 0414 06/26/18 0333 06/27/18 0409  WBC 4.7 6.6 5.7  HGB 10.0* 9.3* 9.2*  HCT 31.4* 29.0* 28.9*  PLT 95* 100* 106*  NA 137 136  --   K 5.2* 4.9  --   CL 104 105  --   CO2 26 25  --   BUN 15 22  --   CREATININE 0.88 0.91  --   GLUCOSE 327* 379*  --   CALCIUM 8.6* 8.8*  --      Discharge Medications:   Allergies as of 06/27/2018   No Known Allergies     Medication List    STOP taking these medications   ibuprofen 200 MG tablet Commonly known as:  ADVIL,MOTRIN   ICY HOT EX     TAKE these medications   APPLE CIDER VINEGAR PO Take 450 mg by mouth at bedtime.   aspirin EC 325 MG tablet Take 1 tablet (325 mg total) by mouth 2 (two) times daily after a meal. Take x 1 month post op to decrease risk of blood clots. What changed:    medication strength  how much to take  when to take this  additional instructions   BASAGLAR KWIKPEN 100 UNIT/ML Sopn Inject 65-75 Units into the skin See admin instructions. Inject 65 units in the morning and 75 units in the evening   cloNIDine 0.3 MG  tablet Commonly known as:  CATAPRES Take 0.3 mg by mouth at bedtime.   CRANBERRY-VITAMIN C PO Take 2 tablets by mouth daily.   docusate sodium 100 MG capsule Commonly known as:  COLACE Take 1 capsule (100 mg total) by mouth 2 (two) times daily.   Fish Oil 1000 MG Caps Take 1,000 mg by mouth daily.   gabapentin 300 MG capsule Commonly known as:  NEURONTIN Take 300 mg by mouth at bedtime.   insulin regular 100 units/mL injection Commonly known as:  NOVOLIN R,HUMULIN R Inject 30-50 Units into the skin 3 (three) times daily before meals.   levothyroxine 150 MCG tablet Commonly known as:  SYNTHROID, LEVOTHROID Take 150 mcg by mouth daily before breakfast.   omeprazole 20 MG tablet Commonly known as:  PRILOSEC OTC Take 20 mg by mouth daily.   oxyCODONE-acetaminophen 5-325 MG  tablet Commonly known as:  PERCOCET/ROXICET Take 1-2 tablets by mouth every 6 (six) hours as needed for severe pain.   PARoxetine 20 MG tablet Commonly known as:  PAXIL Take 20 mg by mouth at bedtime.   PRENATAL PO Take 1 capsule by mouth daily.   tiZANidine 2 MG tablet Commonly known as:  ZANAFLEX Take 1 tablet (2 mg total) by mouth every 8 (eight) hours as needed for muscle spasms.            Durable Medical Equipment  (From admission, onward)         Start     Ordered   06/25/18 1111  For home use only DME 3 n 1  Once     06/25/18 1110           Discharge Care Instructions  (From admission, onward)         Start     Ordered   06/27/18 0000  Weight bearing as tolerated    Question Answer Comment  Laterality left   Extremity Lower      06/27/18 0820          Diagnostic Studies: Dg Chest 2 View  Result Date: 06/17/2018 CLINICAL DATA:  Preop for knee surgery EXAM: CHEST - 2 VIEW COMPARISON:  None. FINDINGS: Normal heart size. Lungs clear. No pneumothorax. No pleural effusion. IMPRESSION: No active cardiopulmonary disease. Electronically Signed   By: Marybelle Killings M.D.   On: 06/17/2018 15:46    Disposition: Discharge disposition: 01-Home or Self Care       Discharge Instructions    CPM   Complete by:  As directed    Continuous passive motion machine (CPM):      Use the CPM from 0 to 60 for 8 hours per day.      You may increase by 5-10 per day.  You may break it up into 2 or 3 sessions per day.      Use CPM for 1-2 weeks or until you are told to stop.   Call MD / Call 911   Complete by:  As directed    If you experience chest pain or shortness of breath, CALL 911 and be transported to the hospital emergency room.  If you develope a fever above 101 F, pus (white drainage) or increased drainage or redness at the wound, or calf pain, call your surgeon's office.   Diet Carb Modified   Complete by:  As directed    Do not put a pillow under the knee.  Place it under the heel.   Complete by:  As directed  Increase activity slowly as tolerated   Complete by:  As directed    Weight bearing as tolerated   Complete by:  As directed    Laterality:  left   Extremity:  Lower      Follow-up Information    Dorna Leitz, MD. Go on 07/04/2018.   Specialty:  Orthopedic Surgery Why:  Your appointment has been made for 1045  Contact information: [place] [address] [phone]        [facility], [place]. Go on 07/04/2018.   Why:  You are scheduled to start outpatient physical therapy at 1200. Please go over after your follow up appointment to begin your paperwork. It is in the same office as Dr. Marcell Barlow information: Physical Therapy [address] [place] [address] [phone]        Home, [facility] Follow up.   Specialty:  [place] Why:  HHPT will see you for 5 visits prior to your MD follow up  Contact information: [place] [address] [phone]            Signed: Erlene Senters 06/27/2018, 8:20 AM

## 2018-06-28 DIAGNOSIS — Z794 Long term (current) use of insulin: Secondary | ICD-10-CM | POA: Diagnosis not present

## 2018-06-28 DIAGNOSIS — G2581 Restless legs syndrome: Secondary | ICD-10-CM | POA: Diagnosis not present

## 2018-06-28 DIAGNOSIS — F419 Anxiety disorder, unspecified: Secondary | ICD-10-CM | POA: Diagnosis not present

## 2018-06-28 DIAGNOSIS — M519 Unspecified thoracic, thoracolumbar and lumbosacral intervertebral disc disorder: Secondary | ICD-10-CM | POA: Diagnosis not present

## 2018-06-28 DIAGNOSIS — E039 Hypothyroidism, unspecified: Secondary | ICD-10-CM | POA: Diagnosis not present

## 2018-06-28 DIAGNOSIS — G479 Sleep disorder, unspecified: Secondary | ICD-10-CM | POA: Diagnosis not present

## 2018-06-28 DIAGNOSIS — Z9181 History of falling: Secondary | ICD-10-CM | POA: Diagnosis not present

## 2018-06-28 DIAGNOSIS — Z7982 Long term (current) use of aspirin: Secondary | ICD-10-CM | POA: Diagnosis not present

## 2018-06-28 DIAGNOSIS — I10 Essential (primary) hypertension: Secondary | ICD-10-CM | POA: Diagnosis not present

## 2018-06-28 DIAGNOSIS — K5730 Diverticulosis of large intestine without perforation or abscess without bleeding: Secondary | ICD-10-CM | POA: Diagnosis not present

## 2018-06-28 DIAGNOSIS — Z96652 Presence of left artificial knee joint: Secondary | ICD-10-CM | POA: Diagnosis not present

## 2018-06-28 DIAGNOSIS — Z471 Aftercare following joint replacement surgery: Secondary | ICD-10-CM | POA: Diagnosis not present

## 2018-06-28 DIAGNOSIS — G43909 Migraine, unspecified, not intractable, without status migrainosus: Secondary | ICD-10-CM | POA: Diagnosis not present

## 2018-06-28 DIAGNOSIS — F329 Major depressive disorder, single episode, unspecified: Secondary | ICD-10-CM | POA: Diagnosis not present

## 2018-06-28 DIAGNOSIS — K219 Gastro-esophageal reflux disease without esophagitis: Secondary | ICD-10-CM | POA: Diagnosis not present

## 2018-06-28 DIAGNOSIS — E1142 Type 2 diabetes mellitus with diabetic polyneuropathy: Secondary | ICD-10-CM | POA: Diagnosis not present

## 2018-06-29 ENCOUNTER — Encounter (HOSPITAL_COMMUNITY): Payer: Self-pay | Admitting: Orthopedic Surgery

## 2018-06-29 DIAGNOSIS — I10 Essential (primary) hypertension: Secondary | ICD-10-CM | POA: Diagnosis not present

## 2018-06-29 DIAGNOSIS — F419 Anxiety disorder, unspecified: Secondary | ICD-10-CM | POA: Diagnosis not present

## 2018-06-29 DIAGNOSIS — E1142 Type 2 diabetes mellitus with diabetic polyneuropathy: Secondary | ICD-10-CM | POA: Diagnosis not present

## 2018-06-29 DIAGNOSIS — F329 Major depressive disorder, single episode, unspecified: Secondary | ICD-10-CM | POA: Diagnosis not present

## 2018-06-29 DIAGNOSIS — M519 Unspecified thoracic, thoracolumbar and lumbosacral intervertebral disc disorder: Secondary | ICD-10-CM | POA: Diagnosis not present

## 2018-06-29 DIAGNOSIS — Z471 Aftercare following joint replacement surgery: Secondary | ICD-10-CM | POA: Diagnosis not present

## 2018-06-30 DIAGNOSIS — M519 Unspecified thoracic, thoracolumbar and lumbosacral intervertebral disc disorder: Secondary | ICD-10-CM | POA: Diagnosis not present

## 2018-06-30 DIAGNOSIS — F329 Major depressive disorder, single episode, unspecified: Secondary | ICD-10-CM | POA: Diagnosis not present

## 2018-06-30 DIAGNOSIS — I10 Essential (primary) hypertension: Secondary | ICD-10-CM | POA: Diagnosis not present

## 2018-06-30 DIAGNOSIS — Z471 Aftercare following joint replacement surgery: Secondary | ICD-10-CM | POA: Diagnosis not present

## 2018-06-30 DIAGNOSIS — E1142 Type 2 diabetes mellitus with diabetic polyneuropathy: Secondary | ICD-10-CM | POA: Diagnosis not present

## 2018-06-30 DIAGNOSIS — F419 Anxiety disorder, unspecified: Secondary | ICD-10-CM | POA: Diagnosis not present

## 2018-07-01 DIAGNOSIS — F329 Major depressive disorder, single episode, unspecified: Secondary | ICD-10-CM | POA: Diagnosis not present

## 2018-07-01 DIAGNOSIS — Z471 Aftercare following joint replacement surgery: Secondary | ICD-10-CM | POA: Diagnosis not present

## 2018-07-01 DIAGNOSIS — M519 Unspecified thoracic, thoracolumbar and lumbosacral intervertebral disc disorder: Secondary | ICD-10-CM | POA: Diagnosis not present

## 2018-07-01 DIAGNOSIS — F419 Anxiety disorder, unspecified: Secondary | ICD-10-CM | POA: Diagnosis not present

## 2018-07-01 DIAGNOSIS — I10 Essential (primary) hypertension: Secondary | ICD-10-CM | POA: Diagnosis not present

## 2018-07-01 DIAGNOSIS — E1142 Type 2 diabetes mellitus with diabetic polyneuropathy: Secondary | ICD-10-CM | POA: Diagnosis not present

## 2018-07-02 DIAGNOSIS — M519 Unspecified thoracic, thoracolumbar and lumbosacral intervertebral disc disorder: Secondary | ICD-10-CM | POA: Diagnosis not present

## 2018-07-02 DIAGNOSIS — F419 Anxiety disorder, unspecified: Secondary | ICD-10-CM | POA: Diagnosis not present

## 2018-07-02 DIAGNOSIS — E1142 Type 2 diabetes mellitus with diabetic polyneuropathy: Secondary | ICD-10-CM | POA: Diagnosis not present

## 2018-07-02 DIAGNOSIS — F329 Major depressive disorder, single episode, unspecified: Secondary | ICD-10-CM | POA: Diagnosis not present

## 2018-07-02 DIAGNOSIS — Z471 Aftercare following joint replacement surgery: Secondary | ICD-10-CM | POA: Diagnosis not present

## 2018-07-02 DIAGNOSIS — I10 Essential (primary) hypertension: Secondary | ICD-10-CM | POA: Diagnosis not present

## 2018-07-04 DIAGNOSIS — Z4789 Encounter for other orthopedic aftercare: Secondary | ICD-10-CM | POA: Diagnosis not present

## 2018-07-04 DIAGNOSIS — Z96652 Presence of left artificial knee joint: Secondary | ICD-10-CM | POA: Diagnosis not present

## 2018-07-04 DIAGNOSIS — M25562 Pain in left knee: Secondary | ICD-10-CM | POA: Diagnosis not present

## 2018-07-05 DIAGNOSIS — M25562 Pain in left knee: Secondary | ICD-10-CM | POA: Diagnosis not present

## 2018-07-06 DIAGNOSIS — M25562 Pain in left knee: Secondary | ICD-10-CM | POA: Diagnosis not present

## 2018-07-06 DIAGNOSIS — Z4789 Encounter for other orthopedic aftercare: Secondary | ICD-10-CM | POA: Diagnosis not present

## 2018-07-06 DIAGNOSIS — Z96652 Presence of left artificial knee joint: Secondary | ICD-10-CM | POA: Diagnosis not present

## 2018-07-12 DIAGNOSIS — Z96652 Presence of left artificial knee joint: Secondary | ICD-10-CM | POA: Diagnosis not present

## 2018-07-12 DIAGNOSIS — M25562 Pain in left knee: Secondary | ICD-10-CM | POA: Diagnosis not present

## 2018-07-12 DIAGNOSIS — Z4789 Encounter for other orthopedic aftercare: Secondary | ICD-10-CM | POA: Diagnosis not present

## 2018-07-14 DIAGNOSIS — M25562 Pain in left knee: Secondary | ICD-10-CM | POA: Diagnosis not present

## 2018-07-14 DIAGNOSIS — Z4789 Encounter for other orthopedic aftercare: Secondary | ICD-10-CM | POA: Diagnosis not present

## 2018-07-14 DIAGNOSIS — Z96652 Presence of left artificial knee joint: Secondary | ICD-10-CM | POA: Diagnosis not present

## 2018-07-19 DIAGNOSIS — Z4789 Encounter for other orthopedic aftercare: Secondary | ICD-10-CM | POA: Diagnosis not present

## 2018-07-19 DIAGNOSIS — Z96652 Presence of left artificial knee joint: Secondary | ICD-10-CM | POA: Diagnosis not present

## 2018-07-19 DIAGNOSIS — M25562 Pain in left knee: Secondary | ICD-10-CM | POA: Diagnosis not present

## 2018-07-26 DIAGNOSIS — M25562 Pain in left knee: Secondary | ICD-10-CM | POA: Diagnosis not present

## 2018-07-26 DIAGNOSIS — Z4789 Encounter for other orthopedic aftercare: Secondary | ICD-10-CM | POA: Diagnosis not present

## 2018-07-26 DIAGNOSIS — Z96652 Presence of left artificial knee joint: Secondary | ICD-10-CM | POA: Diagnosis not present

## 2018-07-26 DIAGNOSIS — Z471 Aftercare following joint replacement surgery: Secondary | ICD-10-CM | POA: Diagnosis not present

## 2018-07-28 DIAGNOSIS — Z4789 Encounter for other orthopedic aftercare: Secondary | ICD-10-CM | POA: Diagnosis not present

## 2018-07-28 DIAGNOSIS — Z96652 Presence of left artificial knee joint: Secondary | ICD-10-CM | POA: Diagnosis not present

## 2018-07-28 DIAGNOSIS — M25562 Pain in left knee: Secondary | ICD-10-CM | POA: Diagnosis not present

## 2018-08-01 DIAGNOSIS — Z4789 Encounter for other orthopedic aftercare: Secondary | ICD-10-CM | POA: Diagnosis not present

## 2018-08-01 DIAGNOSIS — M25562 Pain in left knee: Secondary | ICD-10-CM | POA: Diagnosis not present

## 2018-08-01 DIAGNOSIS — Z96652 Presence of left artificial knee joint: Secondary | ICD-10-CM | POA: Diagnosis not present

## 2018-08-02 DIAGNOSIS — I10 Essential (primary) hypertension: Secondary | ICD-10-CM | POA: Diagnosis not present

## 2018-08-02 DIAGNOSIS — R197 Diarrhea, unspecified: Secondary | ICD-10-CM | POA: Diagnosis not present

## 2018-08-03 DIAGNOSIS — R197 Diarrhea, unspecified: Secondary | ICD-10-CM | POA: Diagnosis not present

## 2018-08-18 DIAGNOSIS — M25561 Pain in right knee: Secondary | ICD-10-CM | POA: Diagnosis not present

## 2018-08-18 DIAGNOSIS — Z96652 Presence of left artificial knee joint: Secondary | ICD-10-CM | POA: Diagnosis not present

## 2018-08-18 DIAGNOSIS — M6281 Muscle weakness (generalized): Secondary | ICD-10-CM | POA: Diagnosis not present

## 2018-08-18 DIAGNOSIS — M2560 Stiffness of unspecified joint, not elsewhere classified: Secondary | ICD-10-CM | POA: Diagnosis not present

## 2018-08-22 DIAGNOSIS — M25561 Pain in right knee: Secondary | ICD-10-CM | POA: Diagnosis not present

## 2018-08-22 DIAGNOSIS — M2560 Stiffness of unspecified joint, not elsewhere classified: Secondary | ICD-10-CM | POA: Diagnosis not present

## 2018-08-22 DIAGNOSIS — M6281 Muscle weakness (generalized): Secondary | ICD-10-CM | POA: Diagnosis not present

## 2018-08-22 DIAGNOSIS — Z96652 Presence of left artificial knee joint: Secondary | ICD-10-CM | POA: Diagnosis not present

## 2018-08-24 DIAGNOSIS — M2560 Stiffness of unspecified joint, not elsewhere classified: Secondary | ICD-10-CM | POA: Diagnosis not present

## 2018-08-24 DIAGNOSIS — M6281 Muscle weakness (generalized): Secondary | ICD-10-CM | POA: Diagnosis not present

## 2018-08-24 DIAGNOSIS — Z96652 Presence of left artificial knee joint: Secondary | ICD-10-CM | POA: Diagnosis not present

## 2018-08-24 DIAGNOSIS — M25561 Pain in right knee: Secondary | ICD-10-CM | POA: Diagnosis not present

## 2018-08-29 DIAGNOSIS — M6281 Muscle weakness (generalized): Secondary | ICD-10-CM | POA: Diagnosis not present

## 2018-08-29 DIAGNOSIS — M25561 Pain in right knee: Secondary | ICD-10-CM | POA: Diagnosis not present

## 2018-08-29 DIAGNOSIS — Z96652 Presence of left artificial knee joint: Secondary | ICD-10-CM | POA: Diagnosis not present

## 2018-08-29 DIAGNOSIS — M2560 Stiffness of unspecified joint, not elsewhere classified: Secondary | ICD-10-CM | POA: Diagnosis not present

## 2018-08-31 DIAGNOSIS — R609 Edema, unspecified: Secondary | ICD-10-CM | POA: Diagnosis not present

## 2018-08-31 DIAGNOSIS — E7800 Pure hypercholesterolemia, unspecified: Secondary | ICD-10-CM | POA: Diagnosis not present

## 2018-08-31 DIAGNOSIS — I10 Essential (primary) hypertension: Secondary | ICD-10-CM | POA: Diagnosis not present

## 2018-08-31 DIAGNOSIS — E1142 Type 2 diabetes mellitus with diabetic polyneuropathy: Secondary | ICD-10-CM | POA: Diagnosis not present

## 2018-08-31 DIAGNOSIS — E039 Hypothyroidism, unspecified: Secondary | ICD-10-CM | POA: Diagnosis not present

## 2018-08-31 DIAGNOSIS — E1165 Type 2 diabetes mellitus with hyperglycemia: Secondary | ICD-10-CM | POA: Diagnosis not present

## 2018-08-31 DIAGNOSIS — Z0000 Encounter for general adult medical examination without abnormal findings: Secondary | ICD-10-CM | POA: Diagnosis not present

## 2018-08-31 DIAGNOSIS — E559 Vitamin D deficiency, unspecified: Secondary | ICD-10-CM | POA: Diagnosis not present

## 2018-09-01 DIAGNOSIS — S8002XA Contusion of left knee, initial encounter: Secondary | ICD-10-CM | POA: Diagnosis not present

## 2018-09-01 DIAGNOSIS — M19012 Primary osteoarthritis, left shoulder: Secondary | ICD-10-CM | POA: Diagnosis not present

## 2018-09-01 DIAGNOSIS — M19011 Primary osteoarthritis, right shoulder: Secondary | ICD-10-CM | POA: Diagnosis not present

## 2018-09-02 DIAGNOSIS — Z96652 Presence of left artificial knee joint: Secondary | ICD-10-CM | POA: Diagnosis not present

## 2018-09-02 DIAGNOSIS — M2560 Stiffness of unspecified joint, not elsewhere classified: Secondary | ICD-10-CM | POA: Diagnosis not present

## 2018-09-02 DIAGNOSIS — M25561 Pain in right knee: Secondary | ICD-10-CM | POA: Diagnosis not present

## 2018-09-02 DIAGNOSIS — M6281 Muscle weakness (generalized): Secondary | ICD-10-CM | POA: Diagnosis not present

## 2018-09-14 DIAGNOSIS — J019 Acute sinusitis, unspecified: Secondary | ICD-10-CM | POA: Diagnosis not present

## 2018-09-28 DIAGNOSIS — M2560 Stiffness of unspecified joint, not elsewhere classified: Secondary | ICD-10-CM | POA: Diagnosis not present

## 2018-09-28 DIAGNOSIS — M25561 Pain in right knee: Secondary | ICD-10-CM | POA: Diagnosis not present

## 2018-09-28 DIAGNOSIS — M6281 Muscle weakness (generalized): Secondary | ICD-10-CM | POA: Diagnosis not present

## 2018-09-28 DIAGNOSIS — Z96652 Presence of left artificial knee joint: Secondary | ICD-10-CM | POA: Diagnosis not present

## 2020-02-21 IMAGING — CR DG CHEST 2V
2 series · 2 of 2 positions shown · non-contrast
Comparison: None.

CLINICAL DATA: Preop for knee surgery

EXAM:
CHEST - 2 VIEW

[w chest pa]
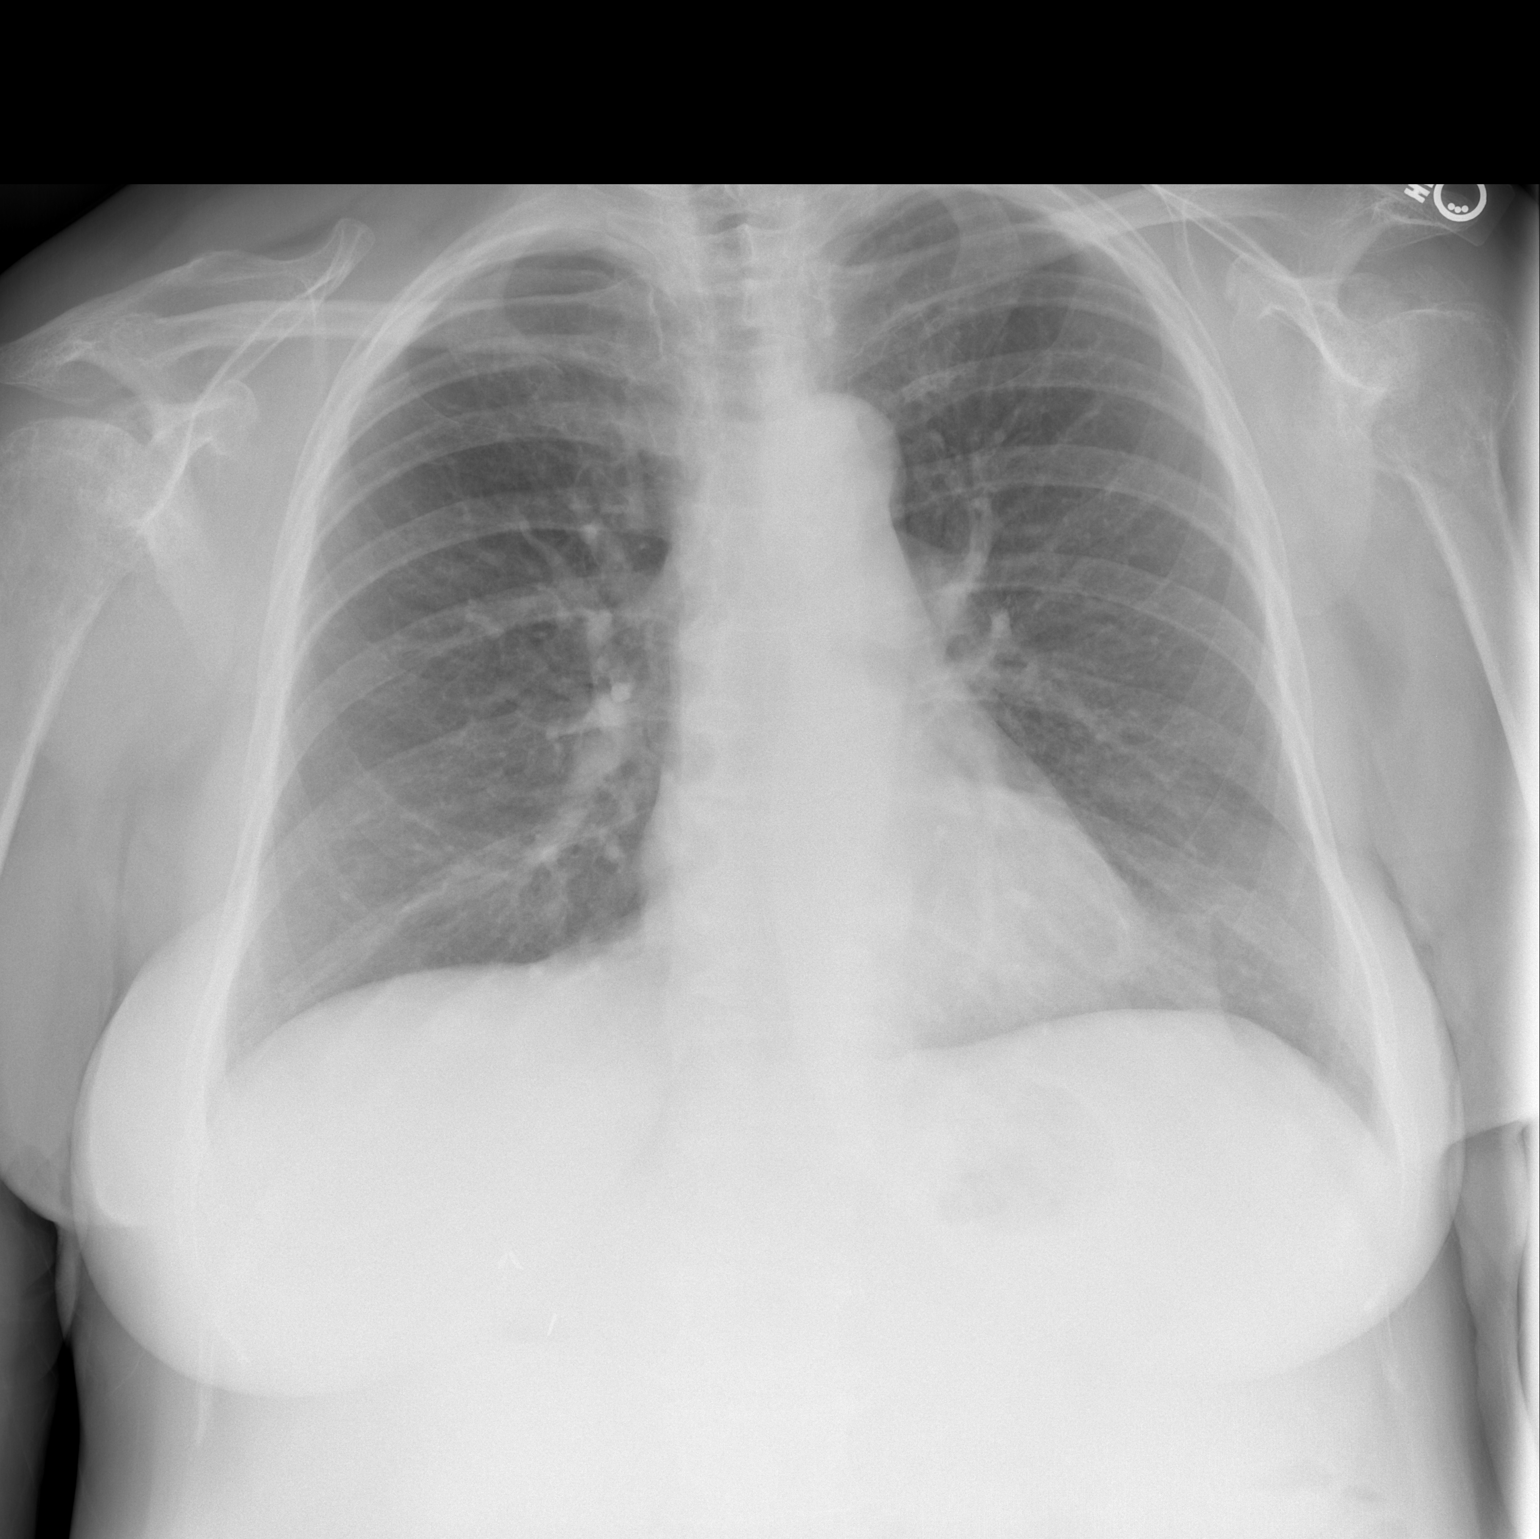

[w chest lat]
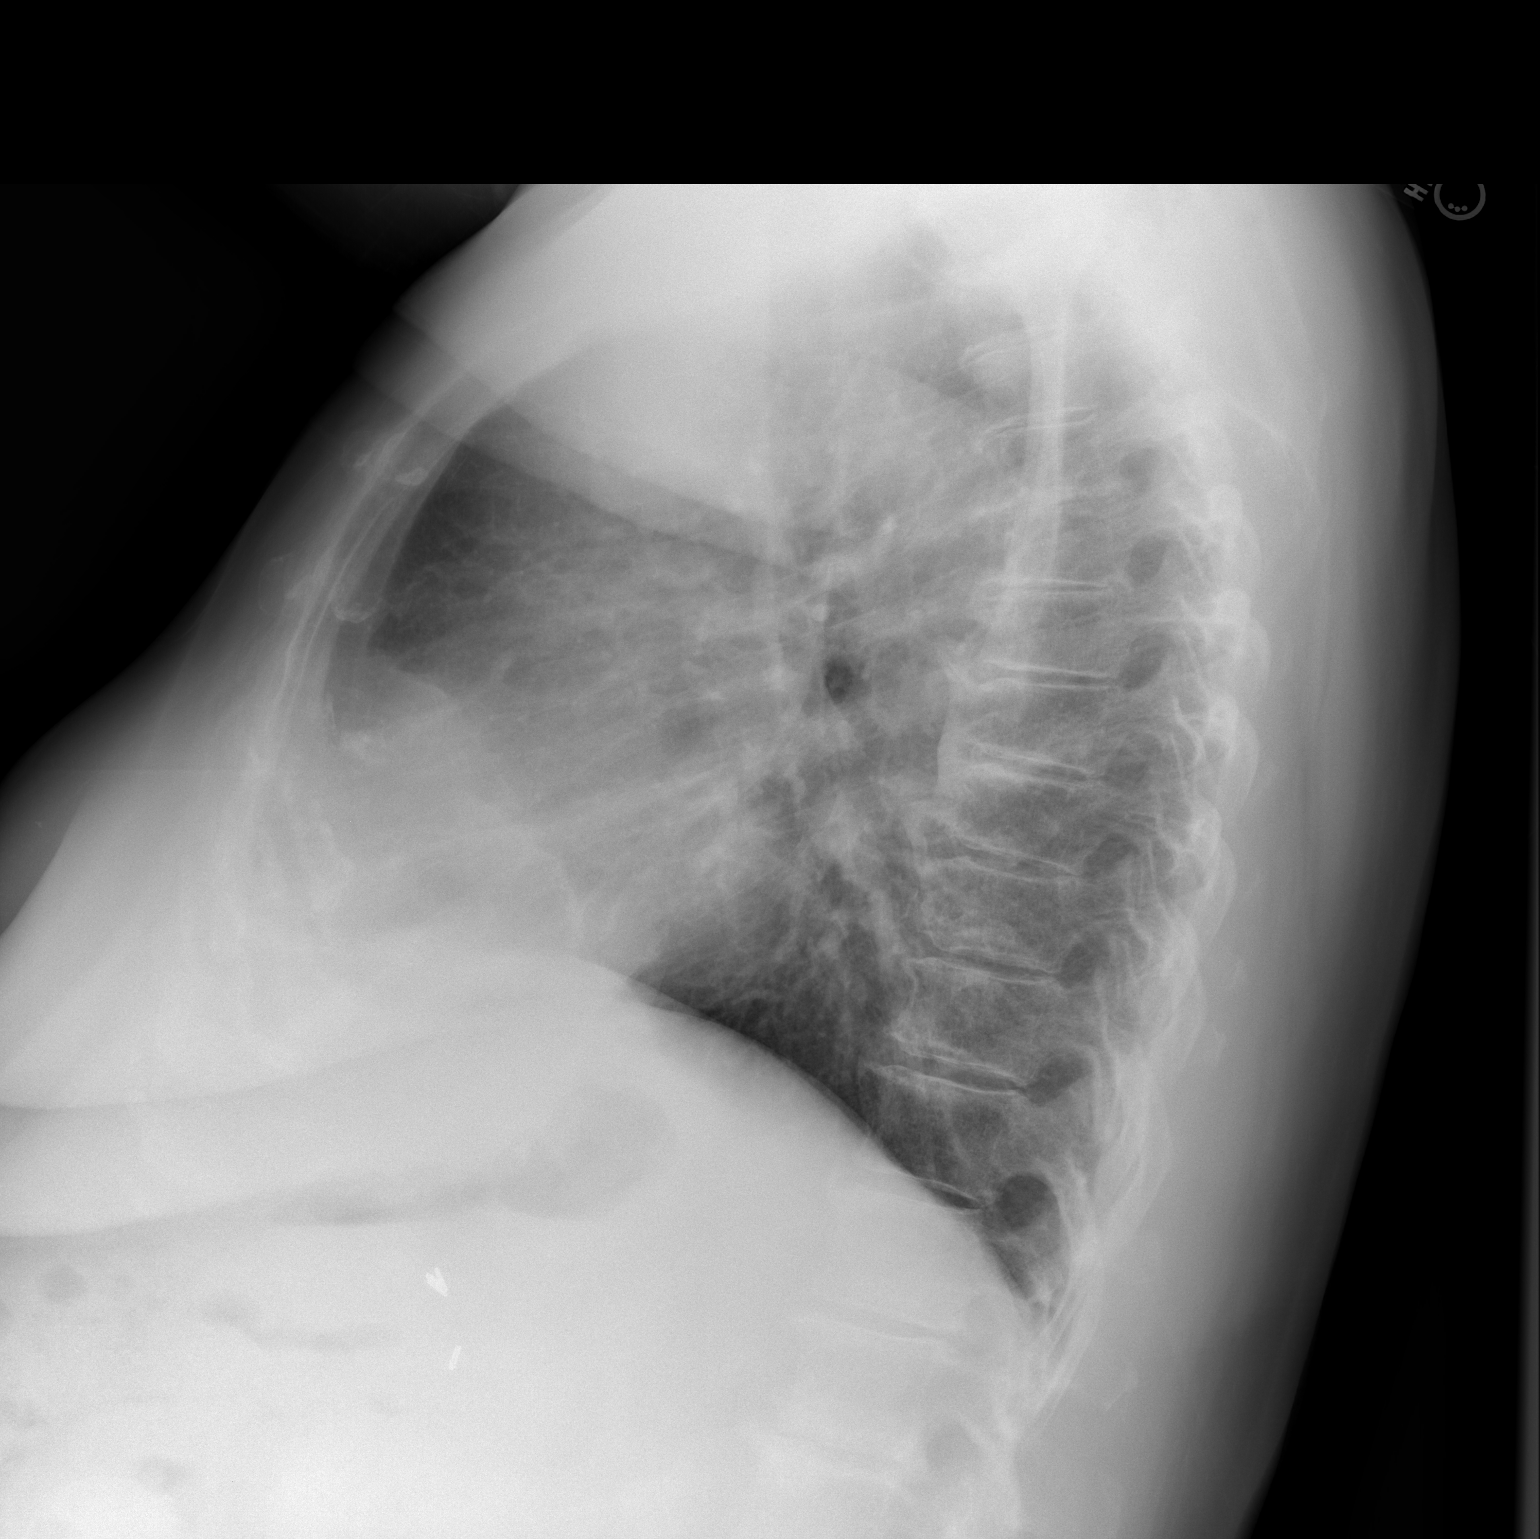

[2 of 2 positions shown; findings below may reference images not displayed]

FINDINGS: Normal heart size. Lungs clear. No pneumothorax. No pleural
effusion.
IMPRESSION: No active cardiopulmonary disease.
